# Patient Record
Sex: Female | Born: 1990 | Race: White | Hispanic: No | Marital: Married | Smoking: Former smoker
Health system: Southern US, Community
[De-identification: ages and names within clinical notes are randomized; demographics above are authoritative.]

## PROBLEM LIST (undated history)

## (undated) DIAGNOSIS — K8020 Calculus of gallbladder without cholecystitis without obstruction: Secondary | ICD-10-CM

## (undated) DIAGNOSIS — K819 Cholecystitis, unspecified: Secondary | ICD-10-CM

## (undated) DIAGNOSIS — E039 Hypothyroidism, unspecified: Secondary | ICD-10-CM

## (undated) HISTORY — PX: WISDOM TOOTH EXTRACTION: SHX21

## (undated) HISTORY — DX: Hypothyroidism, unspecified: E03.9

---
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
## 2011-01-27 LAB — POCT RAPID STREP A: Streptococcus, Group A Screen (Direct): NEGATIVE

## 2011-02-04 LAB — POCT RAPID STREP A: Streptococcus, Group A Screen (Direct): NEGATIVE

## 2011-06-07 ENCOUNTER — Other Ambulatory Visit: Payer: Self-pay | Admitting: *Deleted

## 2011-06-07 ENCOUNTER — Encounter: Payer: Self-pay | Admitting: Gynecology

## 2011-06-07 DIAGNOSIS — N946 Dysmenorrhea, unspecified: Secondary | ICD-10-CM | POA: Insufficient documentation

## 2011-06-07 DIAGNOSIS — S32059A Unspecified fracture of fifth lumbar vertebra, initial encounter for closed fracture: Secondary | ICD-10-CM | POA: Insufficient documentation

## 2011-06-07 MED ORDER — NORETHINDRONE ACET-ETHINYL EST 1-20 MG-MCG PO TABS: 1.0000 | ORAL_TABLET | Freq: Every day | ORAL | Status: DC

## 2011-06-15 ENCOUNTER — Encounter: Payer: Self-pay | Admitting: Women's Health

## 2011-06-20 ENCOUNTER — Ambulatory Visit (INDEPENDENT_AMBULATORY_CARE_PROVIDER_SITE_OTHER): Payer: 59 | Admitting: Women's Health

## 2011-06-20 ENCOUNTER — Encounter: Payer: Self-pay | Admitting: Women's Health

## 2011-06-20 VITALS — BP 114/70 | Ht 65.5 in | Wt 172.0 lb

## 2011-06-20 DIAGNOSIS — Z309 Encounter for contraceptive management, unspecified: Secondary | ICD-10-CM

## 2011-06-20 DIAGNOSIS — Z833 Family history of diabetes mellitus: Secondary | ICD-10-CM

## 2011-06-20 DIAGNOSIS — Z01419 Encounter for gynecological examination (general) (routine) without abnormal findings: Secondary | ICD-10-CM

## 2011-06-20 DIAGNOSIS — IMO0001 Reserved for inherently not codable concepts without codable children: Secondary | ICD-10-CM

## 2011-06-20 LAB — CBC WITH DIFFERENTIAL/PLATELET
Basophils Absolute: 0 10*3/uL (ref 0.0–0.1)
Basophils Relative: 0 % (ref 0–1)
Eosinophils Absolute: 0 10*3/uL (ref 0.0–0.7)
Eosinophils Relative: 1 % (ref 0–5)
HCT: 40.1 % (ref 36.0–46.0)
Hemoglobin: 13.5 g/dL (ref 12.0–15.0)
Lymphs Abs: 2 10*3/uL (ref 0.7–4.0)
MCH: 29.6 pg (ref 26.0–34.0)
MCHC: 33.7 g/dL (ref 30.0–36.0)
MCV: 87.9 fL (ref 78.0–100.0)
Monocytes Absolute: 0.4 10*3/uL (ref 0.1–1.0)
Monocytes Relative: 6 % (ref 3–12)
Neutro Abs: 4.7 10*3/uL (ref 1.7–7.7)
Neutrophils Relative %: 65 % (ref 43–77)
Platelets: 275 10*3/uL (ref 150–400)
RBC: 4.56 MIL/uL (ref 3.87–5.11)
RDW: 12.8 % (ref 11.5–15.5)
WBC: 7.2 10*3/uL (ref 4.0–10.5)

## 2011-06-20 LAB — GLUCOSE, RANDOM: Glucose, Bld: 90 mg/dL (ref 70–99)

## 2011-06-20 MED ORDER — NORETHINDRONE ACET-ETHINYL EST 1-20 MG-MCG PO TABS: 1.0000 | ORAL_TABLET | Freq: Every day | ORAL | Status: DC

## 2011-06-20 NOTE — Progress Notes (Signed)
Yesenia Scott 1990-05-23 [id number]    History:    The patient presents for annual exam.  Contracepting on Loestrin 1/20 with good relief of dysmenorrhea. Same partner. Completed gardasil series in 08.   Past medical history, past surgical history, family history and social history were all reviewed and documented in the EPIC chart. Working at [employer] taking classes at [employer].   ROS:  A  ROS was performed and pertinent positives and negatives are included in the history.  Exam:  Filed Vitals:   06/20/11 1035  BP: 114/70    General appearance:  Normal Head/Neck:  Normal, without cervical or supraclavicular adenopathy. Thyroid:  Symmetrical, normal in size, without palpable masses or nodularity. Respiratory  Effort:  Normal  Auscultation:  Clear without wheezing or rhonchi Cardiovascular  Auscultation:  Regular rate, without rubs, murmurs or gallops  Edema/varicosities:  Not grossly evident Abdominal  Soft,nontender, without masses, guarding or rebound.  Liver/spleen:  No organomegaly noted  Hernia:  None appreciated  Skin  Inspection:  Grossly normal  Palpation:  Grossly normal Neurologic/psychiatric  Orientation:  Normal with appropriate conversation.  Mood/affect:  Normal  Genitourinary    Breasts: Examined lying and sitting.     Right: Without masses, retractions, discharge or axillary adenopathy.     Left: Without masses, retractions, discharge or axillary adenopathy.   Inguinal/mons:  Normal without inguinal adenopathy  External genitalia:  Normal  BUS/Urethra/Skene's glands:  Normal  Bladder:  Normal  Vagina:  Normal  Cervix:  Normal  Uterus:   normal in size, shape and contour.  Midline and mobile  Adnexa/parametria:     Rt: Without masses or tenderness.   Lt: Without masses or tenderness.  Anus and perineum: Normal  Digital rectal exam:   Assessment/Plan:  21 y.o. SWF G0 for annual exam without complaint  Normal GYN exam contracepting on Loestrin  1/20  Plan: Loestrin 1/20 prescription, proper use, slight risk for blood clots and strokes reviewed. Condoms encouraged until permanent partner. SBE's, exercise, MVI daily encouraged. CBC, glucose.(Unable to void, no UTI symptoms)   Harrington Challenger [facility], 11:17 AM 06/20/2011

## 2011-07-13 ENCOUNTER — Other Ambulatory Visit: Payer: Self-pay | Admitting: Plastic Surgery

## 2011-07-13 ENCOUNTER — Encounter (HOSPITAL_BASED_OUTPATIENT_CLINIC_OR_DEPARTMENT_OTHER): Payer: Self-pay | Admitting: *Deleted

## 2011-07-18 ENCOUNTER — Encounter (HOSPITAL_BASED_OUTPATIENT_CLINIC_OR_DEPARTMENT_OTHER): Payer: Self-pay | Admitting: Anesthesiology

## 2011-07-18 ENCOUNTER — Ambulatory Visit (HOSPITAL_BASED_OUTPATIENT_CLINIC_OR_DEPARTMENT_OTHER): Payer: 59 | Admitting: Anesthesiology

## 2011-07-18 ENCOUNTER — Encounter (HOSPITAL_BASED_OUTPATIENT_CLINIC_OR_DEPARTMENT_OTHER)
Admission: RE | Disposition: A | Discharge status: Home / Self Care | Payer: Self-pay | Source: Ambulatory Visit | Attending: Plastic Surgery

## 2011-07-18 ENCOUNTER — Encounter (HOSPITAL_BASED_OUTPATIENT_CLINIC_OR_DEPARTMENT_OTHER): Payer: Self-pay | Admitting: *Deleted

## 2011-07-18 ENCOUNTER — Ambulatory Visit (HOSPITAL_BASED_OUTPATIENT_CLINIC_OR_DEPARTMENT_OTHER)
Admission: RE | Admit: 2011-07-18 | Discharge: 2011-07-18 | Disposition: A | Discharge status: Home / Self Care | Payer: 59 | Source: Ambulatory Visit | Attending: Plastic Surgery | Admitting: Plastic Surgery

## 2011-07-18 DIAGNOSIS — D239 Other benign neoplasm of skin, unspecified: Secondary | ICD-10-CM | POA: Insufficient documentation

## 2011-07-18 DIAGNOSIS — D235 Other benign neoplasm of skin of trunk: Secondary | ICD-10-CM | POA: Insufficient documentation

## 2011-07-18 DIAGNOSIS — Z87891 Personal history of nicotine dependence: Secondary | ICD-10-CM | POA: Insufficient documentation

## 2011-07-18 DIAGNOSIS — L989 Disorder of the skin and subcutaneous tissue, unspecified: Secondary | ICD-10-CM | POA: Diagnosis present

## 2011-07-18 DIAGNOSIS — Z01812 Encounter for preprocedural laboratory examination: Secondary | ICD-10-CM | POA: Insufficient documentation

## 2011-07-18 DIAGNOSIS — D234 Other benign neoplasm of skin of scalp and neck: Secondary | ICD-10-CM | POA: Insufficient documentation

## 2011-07-18 HISTORY — PX: NEVUS EXCISION: SHX5263

## 2011-07-18 LAB — POCT HEMOGLOBIN-HEMACUE: Hemoglobin: 13.9 g/dL (ref 12.0–15.0)

## 2011-07-18 SURGERY — EXCISION, NEVUS
Anesthesia: General | Wound class: Clean

## 2011-07-18 MED ORDER — BACITRACIN ZINC 500 UNIT/GM EX OINT
TOPICAL_OINTMENT | CUTANEOUS | Status: DC | PRN
  Administered 2011-07-18: 1 via TOPICAL

## 2011-07-18 MED ORDER — MIDAZOLAM HCL 5 MG/5ML IJ SOLN
INTRAMUSCULAR | Status: DC | PRN
  Administered 2011-07-18: 1.5 mg via INTRAVENOUS

## 2011-07-18 MED ORDER — CIPROFLOXACIN HCL 500 MG PO TABS: 500.0000 mg | ORAL_TABLET | Freq: Two times a day (BID) | ORAL | Status: AC

## 2011-07-18 MED ORDER — DEXAMETHASONE SODIUM PHOSPHATE 4 MG/ML IJ SOLN
INTRAMUSCULAR | Status: DC | PRN
  Administered 2011-07-18: 10 mg via INTRAVENOUS

## 2011-07-18 MED ORDER — HYDROCODONE-ACETAMINOPHEN 5-325 MG PO TABS: 1.0000 | ORAL_TABLET | Freq: Four times a day (QID) | ORAL | Status: AC | PRN

## 2011-07-18 MED ORDER — CIPROFLOXACIN IN D5W 400 MG/200ML IV SOLN
400.0000 mg | INTRAVENOUS | Status: AC
  Administered 2011-07-18: 400 mg via INTRAVENOUS

## 2011-07-18 MED ORDER — LIDOCAINE-EPINEPHRINE 1 %-1:100000 IJ SOLN
INTRAMUSCULAR | Status: DC | PRN
  Administered 2011-07-18 (×2): 2 mL
  Administered 2011-07-18: 3.5 mL
  Administered 2011-07-18: .5 mL

## 2011-07-18 MED ORDER — LIDOCAINE HCL (CARDIAC) 20 MG/ML IV SOLN
INTRAVENOUS | Status: DC | PRN
  Administered 2011-07-18: 75 mg via INTRAVENOUS

## 2011-07-18 MED ORDER — ONDANSETRON HCL 4 MG/2ML IJ SOLN: 4.0000 mg | Freq: Four times a day (QID) | INTRAMUSCULAR | Status: DC | PRN

## 2011-07-18 MED ORDER — ONDANSETRON HCL 4 MG/2ML IJ SOLN
INTRAMUSCULAR | Status: DC | PRN
  Administered 2011-07-18: 4 mg via INTRAVENOUS

## 2011-07-18 MED ORDER — PROPOFOL 10 MG/ML IV EMUL
INTRAVENOUS | Status: DC | PRN
  Administered 2011-07-18: 200 mg via INTRAVENOUS

## 2011-07-18 MED ORDER — SUCCINYLCHOLINE CHLORIDE 20 MG/ML IJ SOLN
INTRAMUSCULAR | Status: DC | PRN
  Administered 2011-07-18: 100 mg via INTRAVENOUS

## 2011-07-18 MED ORDER — HYDROCODONE-ACETAMINOPHEN 5-325 MG PO TABS
1.0000 | ORAL_TABLET | Freq: Once | ORAL | Status: AC | PRN
  Administered 2011-07-18: 1 via ORAL

## 2011-07-18 MED ORDER — FENTANYL CITRATE 0.05 MG/ML IJ SOLN
INTRAMUSCULAR | Status: DC | PRN
  Administered 2011-07-18: 100 ug via INTRAVENOUS

## 2011-07-18 MED ORDER — HYDROMORPHONE HCL PF 1 MG/ML IJ SOLN
0.2500 mg | INTRAMUSCULAR | Status: DC | PRN
  Administered 2011-07-18: 0.5 mg via INTRAVENOUS

## 2011-07-18 MED ORDER — LACTATED RINGERS IV SOLN
INTRAVENOUS | Status: DC
  Administered 2011-07-18 (×2): via INTRAVENOUS

## 2011-07-18 SURGICAL SUPPLY — 73 items
ADH SKN CLS APL DERMABOND .7 (GAUZE/BANDAGES/DRESSINGS) ×1
APL SKNCLS STERI-STRIP NONHPOA (GAUZE/BANDAGES/DRESSINGS)
BALL CTTN LRG ABS STRL LF (GAUZE/BANDAGES/DRESSINGS) ×1
BANDAGE CONFORM 2  STR LF (GAUZE/BANDAGES/DRESSINGS) IMPLANT
BANDAGE ELASTIC 3 VELCRO ST LF (GAUZE/BANDAGES/DRESSINGS) ×1 IMPLANT
BANDAGE ELASTIC 4 VELCRO ST LF (GAUZE/BANDAGES/DRESSINGS) ×1 IMPLANT
BANDAGE GAUZE ELAST BULKY 4 IN (GAUZE/BANDAGES/DRESSINGS) ×1 IMPLANT
BENZOIN TINCTURE PRP APPL 2/3 (GAUZE/BANDAGES/DRESSINGS) IMPLANT
BLADE SURG 15 STRL LF DISP TIS (BLADE) ×1 IMPLANT
BLADE SURG 15 STRL SS (BLADE) ×6
BLADE SURG ROTATE 9660 (MISCELLANEOUS) IMPLANT
BNDG CMPR MD 5X2 ELC HKLP STRL (GAUZE/BANDAGES/DRESSINGS)
BNDG ELASTIC 2 VLCR STRL LF (GAUZE/BANDAGES/DRESSINGS) IMPLANT
CANISTER SUCTION 1200CC (MISCELLANEOUS) IMPLANT
CHLORAPREP W/TINT 26ML (MISCELLANEOUS) ×3 IMPLANT
CLEANER CAUTERY TIP 5X5 PAD (MISCELLANEOUS) IMPLANT
CLOTH BEACON ORANGE TIMEOUT ST (SAFETY) ×2 IMPLANT
CORDS BIPOLAR (ELECTRODE) IMPLANT
COTTONBALL LRG STERILE PKG (GAUZE/BANDAGES/DRESSINGS) ×1 IMPLANT
COVER MAYO STAND STRL (DRAPES) ×2 IMPLANT
COVER TABLE BACK 60X90 (DRAPES) ×2 IMPLANT
DERMABOND ADVANCED (GAUZE/BANDAGES/DRESSINGS) ×1
DERMABOND ADVANCED .7 DNX12 (GAUZE/BANDAGES/DRESSINGS) ×1 IMPLANT
DRAPE U-SHAPE 76X120 STRL (DRAPES) ×1 IMPLANT
DRSG TEGADERM 2-3/8X2-3/4 SM (GAUZE/BANDAGES/DRESSINGS) ×10 IMPLANT
ELECT COATED BLADE 2.86 ST (ELECTRODE) IMPLANT
ELECT NDL BLADE 2-5/6 (NEEDLE) ×1 IMPLANT
ELECT NEEDLE BLADE 2-5/6 (NEEDLE) ×2 IMPLANT
ELECT REM PT RETURN 9FT ADLT (ELECTROSURGICAL) ×2
ELECT REM PT RETURN 9FT PED (ELECTROSURGICAL)
ELECTRODE REM PT RETRN 9FT PED (ELECTROSURGICAL) IMPLANT
ELECTRODE REM PT RTRN 9FT ADLT (ELECTROSURGICAL) IMPLANT
GAUZE SPONGE 4X4 12PLY STRL LF (GAUZE/BANDAGES/DRESSINGS) ×1 IMPLANT
GAUZE SPONGE 4X4 16PLY XRAY LF (GAUZE/BANDAGES/DRESSINGS) ×1 IMPLANT
GAUZE XEROFORM 1X8 LF (GAUZE/BANDAGES/DRESSINGS) ×1 IMPLANT
GLOVE BIO SURGEON STRL SZ 6.5 (GLOVE) ×8 IMPLANT
GOWN PREVENTION PLUS XLARGE (GOWN DISPOSABLE) ×5 IMPLANT
NDL HYPO 30GX1 BEV (NEEDLE) IMPLANT
NEEDLE 27GAX1X1/2 (NEEDLE) IMPLANT
NEEDLE HYPO 30GX1 BEV (NEEDLE) ×2 IMPLANT
NS IRRIG 1000ML POUR BTL (IV SOLUTION) ×1 IMPLANT
PACK BASIN DAY SURGERY FS (CUSTOM PROCEDURE TRAY) ×2 IMPLANT
PAD CAST 4YDX4 CTTN HI CHSV (CAST SUPPLIES) IMPLANT
PAD CLEANER CAUTERY TIP 5X5 (MISCELLANEOUS)
PADDING CAST COTTON 4X4 STRL (CAST SUPPLIES) ×4
PENCIL BUTTON HOLSTER BLD 10FT (ELECTRODE) ×2 IMPLANT
PUNCH BIOPSY DERMAL 3MM (MISCELLANEOUS) ×2 IMPLANT
RUBBERBAND STERILE (MISCELLANEOUS) IMPLANT
SHEET MEDIUM DRAPE 40X70 STRL (DRAPES) ×1 IMPLANT
SPLINT FAST PLASTER 5X30 (CAST SUPPLIES) ×10
SPLINT PLASTER CAST FAST 5X30 (CAST SUPPLIES) IMPLANT
SPONGE GAUZE 2X2 8PLY STRL LF (GAUZE/BANDAGES/DRESSINGS) ×4 IMPLANT
STRIP CLOSURE SKIN 1/2X4 (GAUZE/BANDAGES/DRESSINGS) ×1 IMPLANT
SUCTION FRAZIER TIP 10 FR DISP (SUCTIONS) IMPLANT
SUT CHROMIC 4 0 P 3 18 (SUTURE) IMPLANT
SUT ETHILON 4 0 PS 2 18 (SUTURE) IMPLANT
SUT ETHILON 5 0 P 3 18 (SUTURE)
SUT MNCRL AB 4-0 PS2 18 (SUTURE) ×1 IMPLANT
SUT MON AB 5-0 P3 18 (SUTURE) IMPLANT
SUT MON AB 5-0 PS2 18 (SUTURE) ×3 IMPLANT
SUT NYLON ETHILON 5-0 P-3 1X18 (SUTURE) IMPLANT
SUT PLAIN 5 0 P 3 18 (SUTURE) IMPLANT
SUT SILK 4 0 P 3 (SUTURE) ×3 IMPLANT
SUT VIC AB 4-0 P-3 18XBRD (SUTURE) IMPLANT
SUT VIC AB 4-0 P3 18 (SUTURE) ×2
SUT VIC AB 5-0 P-3 18X BRD (SUTURE) IMPLANT
SUT VIC AB 5-0 P3 18 (SUTURE) ×2
SUT VICRYL 4-0 PS2 18IN ABS (SUTURE) IMPLANT
SYR BULB 3OZ (MISCELLANEOUS) IMPLANT
SYR CONTROL 10ML LL (SYRINGE) ×2 IMPLANT
TRAY DSU PREP LF (CUSTOM PROCEDURE TRAY) ×2 IMPLANT
TUBE CONNECTING 20X1/4 (TUBING) ×1 IMPLANT
WATER STERILE IRR 1000ML POUR (IV SOLUTION) IMPLANT

## 2011-07-18 NOTE — Transfer of Care (Signed)
Immediate Anesthesia Transfer of Care Note  Patient: Yesenia Scott  Procedure(s) Performed: Procedure(s) (LRB): NEVUS EXCISION (N/A)  Patient Location: PACU  Anesthesia Type: General  Level of Consciousness: awake  Airway & Oxygen Therapy: Patient Spontanous Breathing and Patient connected to face mask oxygen  Post-op Assessment: Report given to PACU RN and Post -op Vital signs reviewed and stable  Post vital signs: Reviewed and stable  Complications: No apparent anesthesia complications

## 2011-07-18 NOTE — Anesthesia Postprocedure Evaluation (Signed)
Anesthesia Post Note  Patient: Yesenia Scott  Procedure(s) Performed: Procedure(s) (LRB): NEVUS EXCISION (N/A)  Anesthesia type: General  Patient location: PACU  Post pain: Pain level controlled and Adequate analgesia  Post assessment: Post-op Vital signs reviewed, Patient's Cardiovascular Status Stable, Respiratory Function Stable, Patent Airway and Pain level controlled  Last Vitals:  Filed Vitals:   07/18/11 1145  BP: 101/55  Pulse: 97  Temp:   Resp: 25    Post vital signs: Reviewed and stable  Level of consciousness: awake, alert  and oriented  Complications: No apparent anesthesia complications

## 2011-07-18 NOTE — Discharge Instructions (Addendum)
May shower May remove tegaderm in 2 days Don't remove splint. May walk on Rt foot.  [facility]  [address] [place], [place] [address] [phone]   Post Anesthesia Home Care Instructions  Activity: Get plenty of rest for the remainder of the day. A responsible adult should stay with you for 24 hours following the procedure.  For the next 24 hours, DO NOT: -Drive a car -Advertising copywriter -Drink alcoholic beverages -Take any medication unless instructed by your physician -Make any legal decisions or sign important papers.  Meals: Start with liquid foods such as gelatin or soup. Progress to regular foods as tolerated. Avoid greasy, spicy, heavy foods. If nausea and/or vomiting occur, drink only clear liquids until the nausea and/or vomiting subsides. Call your physician if vomiting continues.  Special Instructions/Symptoms: Your throat may feel dry or sore from the anesthesia or the breathing tube placed in your throat during surgery. If this causes discomfort, gargle with warm salt water. The discomfort should disappear within 24 hours.

## 2011-07-18 NOTE — Anesthesia Preprocedure Evaluation (Signed)
Anesthesia Evaluation  Patient identified by MRN, date of birth, ID band Patient awake    Reviewed: Allergy & Precautions, H&P , NPO status , Patient's Chart, lab work & pertinent test results  Airway Mallampati: I  Neck ROM: full    Dental   Pulmonary former smoker         Cardiovascular     Neuro/Psych    GI/Hepatic   Endo/Other    Renal/GU      Musculoskeletal   Abdominal   Peds  Hematology   Anesthesia Other Findings   Reproductive/Obstetrics                           Anesthesia Physical Anesthesia Plan  ASA: I  Anesthesia Plan: General   Post-op Pain Management:    Induction: Intravenous  Airway Management Planned: Oral ETT  Additional Equipment:   Intra-op Plan:   Post-operative Plan: Extubation in OR  Informed Consent: I have reviewed the patients History and Physical, chart, labs and discussed the procedure including the risks, benefits and alternatives for the proposed anesthesia with the patient or authorized representative who has indicated his/her understanding and acceptance.     Plan Discussed with: CRNA and Surgeon  Anesthesia Plan Comments:         Anesthesia Quick Evaluation

## 2011-07-18 NOTE — H&P (Signed)
History and Physical  Yesenia Scott   07/05/2011 2:00 PM Office Visit  MRN: [id number]   Department: Plastic Surgery  Dept Phone: [phone]  Description: Female DOB: [birth year]  Provider: Wayland Denis, DO    Diagnoses    Changing skin lesion   - Primary   709.9     Reason for Visit  -  Skin Problem      Subjective:    Patient ID: Yesenia Scott is a 21 y.o. female.  HPI [name] is a 21 yrs old wf here with her mom for history and physical for excision of several changing skin lesions. She has one on her right small medial toe that is hyperpigmented, ~ 1cm, slightly rough and raised. One on her medial left breast, medial right breast and abdomen that are flesh colored and ~16mm in size. One on the left temporal area near the left ear that is flesh colored, ~4 mm in size, slightly raised and itches. There is a nonraised, irregular, dark hyperpigmented lesion on the upper back, ~89mm in size.  All of the lesions are changing, getting larger and are irritating to the patient. She does not have any family history of skin cancer and has not had any herself. Several of the areas have gotten dry and bleed.  The following portions of the patient's history were reviewed and updated as appropriate: allergies, current medications, past family history, past medical history, past social history, past surgical history and problem list.  Review of Systems  Constitutional: Negative.   HENT: Negative.   Eyes: Negative.   Respiratory: Negative.   Cardiovascular: Negative.   Gastrointestinal: Negative.   Genitourinary: Negative.   Musculoskeletal: Negative.   Skin: Negative.   Neurological: Negative.   Hematological: Negative.   Psychiatric/Behavioral: Negative.       Objective:    Physical Exam  Constitutional: She appears well-developed and well-nourished.  HENT:   Head: Normocephalic and atraumatic.  Eyes: Conjunctivae are normal. Pupils are equal, round, and reactive to light.    Neck: Normal range of motion.  Cardiovascular: Normal rate.   Pulmonary/Chest: Effort normal.  Abdominal: Soft.  Musculoskeletal: Normal range of motion.  Neurological: She is alert.  Skin: Skin is warm.  Psychiatric: She has a normal mood and affect. Her behavior is normal. Judgment and thought content normal.   Assessment:  1.  Changing skin lesion     Plan:    Excision of changing skin lesions chest, abdomen, right small toe and scalp.  Risks and complications were reviewed and include bleeding, pain, scar and risk of anesthesia.  The patient was seen in holding, risks reviewed and consent signed.  There is no change in her history or physical.

## 2011-07-18 NOTE — Anesthesia Procedure Notes (Signed)
Procedure Name: Intubation Date/Time: 07/18/2011 9:41 AM Performed by: Lance Coon Pre-anesthesia Checklist: Patient identified, Timeout performed, Emergency Drugs available, Suction available and Patient being monitored Patient Re-evaluated:Patient Re-evaluated prior to inductionOxygen Delivery Method: Circle system utilized Preoxygenation: Pre-oxygenation with 100% oxygen Intubation Type: IV induction Ventilation: Mask ventilation without difficulty Laryngoscope Size: Mac and 3 Grade View: Grade I Tube type: Oral Tube size: 7.0 mm Number of attempts: 1 Airway Equipment and Method: Stylet and LTA kit utilized Placement Confirmation: ETT inserted through vocal cords under direct vision,  positive ETCO2 and breath sounds checked- equal and bilateral Secured at: 18 cm Tube secured with: Tape Dental Injury: Teeth and Oropharynx as per pre-operative assessment

## 2011-07-18 NOTE — Brief Op Note (Signed)
07/18/2011  11:26 AM  PATIENT:  Yesenia Scott  21 y.o. female  PRE-OPERATIVE DIAGNOSIS:  changing skin lesions of left scalp, medial chest, left breast, abdomen back x 2, right small toe  POST-OPERATIVE DIAGNOSIS:  same  PROCEDURE:  Procedure(s) (LRB): excision of changing skin lesions of left scalp, left breast, abdomen and back. Punch biopsy of back left and medial chest lesion.  Excision of right small toe lesion with placement of FTSG  SURGEON:  Surgeon(s) and Role:    * Andron Marrazzo Sanger, DO - Primary  PHYSICIAN ASSISTANT: none  ASSISTANTS: none   ANESTHESIA:   local and general  EBL:  Total I/O In: 1000 [I.V.:1000] Out: -   BLOOD ADMINISTERED:none  DRAINS: none   LOCAL MEDICATIONS USED:  LIDOCAINE   SPECIMEN:  Source of Specimen:  changing skin lesions  DISPOSITION OF SPECIMEN:  PATHOLOGY  COUNTS:  YES  TOURNIQUET:  * No tourniquets in log *  DICTATION: dictated  PLAN OF CARE: Discharge to home after PACU  PATIENT DISPOSITION:  PACU - hemodynamically stable.   Delay start of Pharmacological VTE agent (>24hrs) due to surgical blood loss or risk of bleeding: no

## 2011-07-19 NOTE — Op Note (Signed)
NAME:  LORAN, AUGUSTE                   ACCOUNT NO.:  MEDICAL RECORD NO.:  [id number]  LOCATION:                                 FACILITY:  PHYSICIAN:  Wayland Denis, DO      DATE OF BIRTH:  1990/07/06  DATE OF PROCEDURE:  07/18/2011 DATE OF DISCHARGE:                              OPERATIVE REPORT   PREOPERATIVE DIAGNOSIS:  Changing lesion on scalp, left breast, medial chest, abdomen, back x2, and right small toe.  PROCEDURE: 1. Excision of scalp changing lesion, left breast changing lesion,     abdominal changing lesion, right back changing lesion. 2. Punch biopsy of medial chest lesion, left back lesion. 3. Excision of right small toe lesion with full-thickness skin graft     placement.  ATTENDING:  Wayland Denis, DO  ANESTHESIA:  General.  INDICATION FOR PROCEDURE:  The patient is a 21 year old female who had several changing lesions.  Risks and complications were reviewed including bleeding, pain scar, risk of anesthesia, scar contracture, and she wished to proceed due to the continual changing in look of the lesions and skin irritation.  A consent was signed and confirmed, and the patient was taken to the operating room.  DESCRIPTION OF PROCEDURE:  The patient was taken to the operating room, placed on the operating room table in the supine position.  General anesthesia was administered.  Once adequate, a time-out was called and all information was confirmed to be correct.  She was placed in the right lateral position, was prepped and draped in the usual sterile fashion.  The area of the back on the right and the left were marked and 1% lidocaine with epinephrine was injected.  After waiting several minutes for the epinephrine to take effect, a punch biopsy of 3 mm was performed on the left back lesion and it was closed with vertical mattress 5-0 Monocryl.  The right lesion was excised in elliptical fashion and closed with the 4-0 Monocryl, Steri-Strips, 2 x 2  and Tegaderm was placed.  The right back lesion was 0.5 cm x 0.5 cm.  The patient was then placed in the supine position and re-prepped and draped.  The abdominal lesion was excised in elliptical fashion, 1.5 x 0.5 cm, and sent to Pathology.  The skin edges were reapproximated with a 5-0 Monocryl.  A 3-mm punch biopsy was performed of the right medial chest area and a 5-0 Monocryl was used to reapproximate the skin edges. The left breast was 0.5 x 5.5 cm and elliptical incision was used to excise the lesion.  It was closed with 4-0 Monocryl.  On all of the anterior lesions, the Steri-Strips were applied with a 2 x 2 and a Tegaderm.  Attention was then turned to the toe and the right small medial aspect was marked and 1% lidocaine with epinephrine was injected. Once the intraoperative epinephrine effect was in place, a 15-blade was used to excise the lesion.  The 15- blade was used for all the other lesions as well.  The lesion was excised and marked long stitch lateral and short stitch distal, and the lesion was sent to Pathology.  A full- thickness  skin graft was obtained and this was 1 x 0.5 cm.  The skin graft was obtained from the medial foot.  The area was marked and 1% lidocaine with epinephrine was injected.  After waiting several minutes, the skin was excised at full thickness level and placed on the recipient site.  The donor site was closed with 4-0 Vicryl and 4-0 Monocryl. Antibiotic ointment was applied.  The full-thickness skin graft was tacked into place with 5-0 Vicryl and silk bolsters with the Xeroform. Kerlix and a blocking splint was used to mobilize the foot.  Attention was then turned to the scalp and a 0.5 x 0.4 lesion was excised in elliptical fashion and sent to Pathology.  The skin edges were reapproximated with a 5-0 Monocryl and antibiotic cream was applied.  The patient tolerated the procedure well.  There were no complications.  She was awoken and taken to  recovery room in stable condition.     Wayland Denis, DO     CS/MEDQ  D:  07/18/2011  T:  07/18/2011  Job:  [id number]

## 2011-07-21 ENCOUNTER — Encounter (HOSPITAL_BASED_OUTPATIENT_CLINIC_OR_DEPARTMENT_OTHER): Payer: Self-pay | Admitting: Plastic Surgery

## 2011-07-21 ENCOUNTER — Encounter (HOSPITAL_BASED_OUTPATIENT_CLINIC_OR_DEPARTMENT_OTHER): Payer: Self-pay

## 2011-08-01 HISTORY — PX: OTHER SURGICAL HISTORY: SHX169

## 2012-06-22 ENCOUNTER — Ambulatory Visit (INDEPENDENT_AMBULATORY_CARE_PROVIDER_SITE_OTHER): Payer: 59 | Admitting: Women's Health

## 2012-06-22 ENCOUNTER — Other Ambulatory Visit (HOSPITAL_COMMUNITY)
Admission: RE | Admit: 2012-06-22 | Discharge: 2012-06-22 | Disposition: A | Discharge status: Home / Self Care | Payer: 59 | Source: Ambulatory Visit | Attending: Women's Health | Admitting: Women's Health

## 2012-06-22 ENCOUNTER — Encounter: Payer: Self-pay | Admitting: Women's Health

## 2012-06-22 VITALS — BP 130/70 | Ht 65.0 in | Wt 156.0 lb

## 2012-06-22 DIAGNOSIS — IMO0001 Reserved for inherently not codable concepts without codable children: Secondary | ICD-10-CM

## 2012-06-22 DIAGNOSIS — Z309 Encounter for contraceptive management, unspecified: Secondary | ICD-10-CM

## 2012-06-22 DIAGNOSIS — Z01419 Encounter for gynecological examination (general) (routine) without abnormal findings: Secondary | ICD-10-CM | POA: Insufficient documentation

## 2012-06-22 DIAGNOSIS — Z833 Family history of diabetes mellitus: Secondary | ICD-10-CM

## 2012-06-22 DIAGNOSIS — N898 Other specified noninflammatory disorders of vagina: Secondary | ICD-10-CM

## 2012-06-22 DIAGNOSIS — Z1151 Encounter for screening for human papillomavirus (HPV): Secondary | ICD-10-CM | POA: Insufficient documentation

## 2012-06-22 LAB — CBC WITH DIFFERENTIAL/PLATELET
Basophils Absolute: 0 10*3/uL (ref 0.0–0.1)
Lymphocytes Relative: 29 % (ref 12–46)
Lymphs Abs: 3.2 10*3/uL (ref 0.7–4.0)
Neutro Abs: 7.2 10*3/uL (ref 1.7–7.7)
Neutrophils Relative %: 64 % (ref 43–77)
Platelets: 336 10*3/uL (ref 150–400)
RBC: 4.59 MIL/uL (ref 3.87–5.11)
RDW: 13.3 % (ref 11.5–15.5)
WBC: 11.4 10*3/uL — ABNORMAL HIGH (ref 4.0–10.5)

## 2012-06-22 LAB — WET PREP FOR TRICH, YEAST, CLUE
Clue Cells Wet Prep HPF POC: NONE SEEN
Trich, Wet Prep: NONE SEEN

## 2012-06-22 MED ORDER — TERCONAZOLE 0.4 % VA CREA: 1.0000 | TOPICAL_CREAM | Freq: Every day | VAGINAL | Status: DC

## 2012-06-22 MED ORDER — NORETHINDRONE ACET-ETHINYL EST 1-20 MG-MCG PO TABS: 1.0000 | ORAL_TABLET | Freq: Every day | ORAL | Status: DC

## 2012-06-22 NOTE — Progress Notes (Signed)
Yesenia Scott 12-Jul-1990 [id number]    History:    The patient presents for annual exam.  Regular monthly cycle on Loestrin 1/20. Complaint of a white discharge with some external vaginal itching. Same partner. Gardasil completed 2008. Benign nevus removed 07/2011.   Past medical history, past surgical history, family history and social history were all reviewed and documented in the EPIC chart. Wedding planned October 2014. Works at [employer], going to school [employer]. Mother diabetic.   ROS:  A  ROS was performed and pertinent positives and negatives are included in the history.  Exam:  Filed Vitals:   06/22/12 1039  BP: 130/70    General appearance:  Normal Head/Neck:  Normal, without cervical or supraclavicular adenopathy. Thyroid:  Symmetrical, normal in size, without palpable masses or nodularity. Respiratory  Effort:  Normal  Auscultation:  Clear without wheezing or rhonchi Cardiovascular  Auscultation:  Regular rate, without rubs, murmurs or gallops  Edema/varicosities:  Not grossly evident Abdominal  Soft,nontender, without masses, guarding or rebound.  Liver/spleen:  No organomegaly noted  Hernia:  None appreciated  Skin  Inspection:  Grossly normal  Palpation:  Grossly normal Neurologic/psychiatric  Orientation:  Normal with appropriate conversation.  Mood/affect:  Normal  Genitourinary    Breasts: Examined lying and sitting.     Right: Without masses, retractions, discharge or axillary adenopathy.     Left: Without masses, retractions, discharge or axillary adenopathy.   Inguinal/mons:  Normal without inguinal adenopathy  External genitalia:  erythematous  BUS/Urethra/Skene's glands:  Normal  Bladder:  Normal  Vagina:  Normal-wet prep negative  Cervix:  Normal  Uterus:   normal in size, shape and contour.  Midline and mobile  Adnexa/parametria:     Rt: Without masses or tenderness.   Lt: Without masses or tenderness.  Anus and  perineum: Normal    Assessment/Plan:  22 y.o. S. WF G0 for annual exam.     External vaginal itching Contraceptives on Loestrin 1/20  Plan: Loestrin 1/20 prescription, proper use, slight risk for blood clots and strokes reviewed. SBE's, continue exercise, healthy diet, has lost 15 pounds in the past year with diet and exercise. MVI daily encouraged. CBC, glucose, UA, Pap. New screening guidelines reviewed. Terazol 7 instructed to apply externally as needed for vaginal itching and yeast prevention discussed.    Harrington Challenger WHNP, 11:09 AM 06/22/2012

## 2012-06-22 NOTE — Patient Instructions (Signed)
Health Maintenance, 18- to 21-Year-Old SCHOOL PERFORMANCE After high school completion, the young adult may be attending college, technical or vocational school, or entering the military or the work force. SOCIAL AND EMOTIONAL DEVELOPMENT The young adult establishes adult relationships and explores sexual identity. Young adults may be living at home or in a college dorm or apartment. Increasing independence is important with young adults. Throughout adolescence, teens should assume responsibility of their own health care. IMMUNIZATIONS Most young adults should be fully vaccinated. A booster dose of Tdap (tetanus, diphtheria, and pertussis, or "whooping cough"), a dose of meningococcal vaccine to protect against a certain type of bacterial meningitis, hepatitis A, human papillomarvirus (HPV), chickenpox, or measles vaccines may be indicated, if not given at an earlier age. Annual influenza or "flu" vaccination should be considered during flu season.   TESTING Annual screening for vision and hearing problems is recommended. Vision should be screened objectively at least once between 18 and 21 years of age. The young adult may be screened for anemia or tuberculosis. Young adults should have a blood test to check for high cholesterol during this time period. Young adults should be screened for use of alcohol and drugs. If the young adult is sexually active, screening for sexually transmitted infections, pregnancy, or HIV may be performed. Screening for cervical cancer should be performed within 3 years of beginning sexual activity. NUTRITION AND ORAL HEALTH  Adequate calcium intake is important. Consume 3 servings of low-fat milk and dairy products daily. For those who do not drink milk or consume dairy products, calcium enriched foods, such as juice, bread, or cereal, dark, leafy greens, or canned fish are alternate sources of calcium.   Drink plenty of water. Limit fruit juice to 8 to 12 ounces per day.  Avoid sugary beverages or sodas.   Discourage skipping meals, especially breakfast. Teens should eat a good variety of vegetables and fruits, as well as lean meats.   Avoid high fat, high salt, and high sugar foods, such as candy, chips, and cookies.   Encourage young adults to participate in meal planning and preparation.   Eat meals together as a family whenever possible. Encourage conversation at mealtime.   Limit fast food choices and eating out at restaurants.   Brush teeth twice a day and floss.   Schedule dental exams twice a year.  SLEEP Regular sleep habits are important. PHYSICAL, SOCIAL, AND EMOTIONAL DEVELOPMENT  One hour of regular physical activity daily is recommended. Continue to participate in sports.   Encourage young adults to develop their own interests and consider community service or volunteerism.   Provide guidance to the young adult in making decisions about college and work plans.   Make sure that young adults know that they should never be in a situation that makes them uncomfortable, and they should tell partners if they do not want to engage in sexual activity.   Talk to the young adult about body image. Eating disorders may be noted at this time. Young adults may also be concerned about being overweight. Monitor the young adult for weight gain or loss.   Mood disturbances, depression, anxiety, alcoholism, or attention problems may be noted in young adults. Talk to the caregiver if there are concerns about mental illness.   Negotiate limit setting and independent decision making.   Encourage the young adult to handle conflict without physical violence.   Avoid loud noises which may impair hearing.   Limit television and computer time to 2 hours per   day. Individuals who engage in excessive sedentary activity are more likely to become overweight.  RISK BEHAVIORS  Sexually active young adults need to take precautions against pregnancy and sexually  transmitted infections. Talk to young adults about contraception.   Provide a tobacco-free and drug-free environment for the young adult. Talk to the young adult about drug, tobacco, and alcohol use among friends or at friends' homes. Make sure the young adult knows that smoking tobacco or marijuana and taking drugs have health consequences and may impact brain development.   Teach the young adult about appropriate use of over-the-counter or prescription medicines.   Establish guidelines for driving and for riding with friends.   Talk to young adults about the risks of drinking and driving or boating. Encourage the young adult to call you if he or she or friends have been drinking or using drugs.   Remind young adults to wear seat belts at all times in cars and life vests in boats.   Young adults should always wear a properly fitted helmet when they are riding a bicycle.   Use caution with all-terrain vehicles (ATVs) or other motorized vehicles.   Do not keep handguns in the home. (If you do, the gun and ammunition should be locked separately and out of the young adult's access.)   Equip your home with smoke detectors and change the batteries regularly. Make sure all family members know the fire escape plans for your home.   Teach young adults not to swim alone and not to dive in shallow water.   All individuals should wear sunscreen that protects against UVA and UVB light with at least a sun protection factor (SPF) of 30 when out in the sun. This minimizes sun burning.  WHAT'S NEXT? Young adults should visit their pediatrician or family physician yearly. By young adulthood, health care should be transitioned to a family physician or internal medicine specialist. Sexually active females may want to begin annual physical exams with a gynecologist. Document Released: 07/14/2006 Document Revised: 07/11/2011 Document Reviewed: 08/03/2006 Exit[employer] Patient Information 2013 [employer].    

## 2012-06-22 NOTE — Addendum Note (Signed)
Addended by: Bertram Savin A on: 06/22/2012 04:04 PM   Modules accepted: Orders

## 2012-06-23 LAB — GLUCOSE, RANDOM: Glucose, Bld: 88 mg/dL (ref 70–99)

## 2012-06-23 LAB — URINALYSIS W MICROSCOPIC + REFLEX CULTURE
Bacteria, UA: NONE SEEN
Bilirubin Urine: NEGATIVE
Crystals: NONE SEEN
Nitrite: NEGATIVE
Protein, ur: NEGATIVE mg/dL
Specific Gravity, Urine: >1.03 — ABNORMAL HIGH (ref 1.005–1.030)
Squamous Epithelial / LPF: NONE SEEN
Urobilinogen, UA: 1 mg/dL (ref 0.0–1.0)

## 2012-09-27 ENCOUNTER — Encounter: Payer: Self-pay | Admitting: Family Medicine

## 2012-09-27 ENCOUNTER — Ambulatory Visit (INDEPENDENT_AMBULATORY_CARE_PROVIDER_SITE_OTHER): Payer: 59 | Admitting: Family Medicine

## 2012-09-27 VITALS — BP 108/72 | Temp 98.3°F | Ht 66.0 in | Wt 162.2 lb

## 2012-09-27 DIAGNOSIS — J019 Acute sinusitis, unspecified: Secondary | ICD-10-CM

## 2012-09-27 MED ORDER — AZITHROMYCIN 250 MG PO TABS: ORAL_TABLET | ORAL | Status: DC

## 2012-09-27 NOTE — Progress Notes (Signed)
  Subjective:    Patient ID: Yesenia Scott, female    DOB: [birth year], 22 y.o.   MRN: [id number]  Headache  This is a new problem. The current episode started in the past 7 days. The problem occurs constantly. The problem has been gradually worsening. Pain location: generalized. The pain does not radiate. The quality of the pain is described as throbbing. The pain is at a severity of 5/10. The pain is moderate. Associated symptoms include muscle aches and sinus pressure. Associated symptoms comments: Body aches, sensitivity to light, chills. Nothing aggravates the symptoms. She has tried NSAIDs for the symptoms. The treatment provided mild relief.      Review of Systems  HENT: Positive for sinus pressure.   Neurological: Positive for headaches.   Not a smoker Post nasal drip and cough    Objective:   Physical Exam  Vitals reviewed. Constitutional: She appears well-nourished.  HENT:  Head: Normocephalic.  Right Ear: External ear normal.  Left Ear: External ear normal.  Cardiovascular: Normal rate and normal heart sounds.   No murmur heard. Pulmonary/Chest: Effort normal and breath sounds normal. No respiratory distress.  Lymphadenopathy:    She has no cervical adenopathy.   Moderate sinus tenderness to palpation       Assessment & Plan:  Acute sinusitis- z pack, loratadine

## 2012-09-28 ENCOUNTER — Encounter: Payer: Self-pay | Admitting: Family Medicine

## 2012-10-04 ENCOUNTER — Telehealth: Payer: Self-pay | Admitting: Family Medicine

## 2012-10-04 NOTE — Telephone Encounter (Signed)
New Patient.  No Chart  Patient has an FMLA form that needs to be completed for dates Thursday Sep 27, 2012 through Sunday September 30, 2012, She returned to work on Monday October 01, 2012.    Patient also wanted to let Dr. Lorin Picket know she works around chemicals that she feels that may be causing these problems.  Thanks

## 2012-10-04 NOTE — Telephone Encounter (Signed)
FMLA form given to Toniann Fail to complete

## 2012-10-08 ENCOUNTER — Telehealth: Payer: Self-pay | Admitting: Family Medicine

## 2012-10-08 ENCOUNTER — Other Ambulatory Visit: Payer: Self-pay | Admitting: *Deleted

## 2012-10-08 MED ORDER — LEVOFLOXACIN 500 MG PO TABS: 500.0000 mg | ORAL_TABLET | Freq: Every day | ORAL | Status: AC

## 2012-10-08 NOTE — Telephone Encounter (Signed)
Levaquin 500 milligram, #10, one daily. If not doing better with this office visit would be necessary. Followup sooner problems.

## 2012-10-08 NOTE — Telephone Encounter (Signed)
Levaquin 500 milligram, #10, one daily called into [facility]. Pt notified on voicemail to call back if not better

## 2012-10-08 NOTE — Telephone Encounter (Signed)
No Chart--New Patient  Patient called and states her ears are still stopped up, her throat is still hurting.  States her throat hurts so bad she could cry.  Wants to know if Dr. Lorin Picket would please call her in a different antibiotic, she has completed the Z-Pack.  [facility] in [employer]

## 2013-02-27 ENCOUNTER — Ambulatory Visit (INDEPENDENT_AMBULATORY_CARE_PROVIDER_SITE_OTHER): Payer: 59 | Admitting: Nurse Practitioner

## 2013-02-27 ENCOUNTER — Encounter: Payer: Self-pay | Admitting: Nurse Practitioner

## 2013-02-27 VITALS — BP 112/62 | Temp 99.1°F | Ht 66.5 in | Wt 164.4 lb

## 2013-02-27 DIAGNOSIS — J069 Acute upper respiratory infection, unspecified: Secondary | ICD-10-CM

## 2013-02-27 MED ORDER — AZITHROMYCIN 250 MG PO TABS: ORAL_TABLET | ORAL | Status: DC

## 2013-02-28 ENCOUNTER — Encounter: Payer: Self-pay | Admitting: Nurse Practitioner

## 2013-02-28 NOTE — Progress Notes (Signed)
Subjective:  Presents complaints of sore throat for the past 2 days. No fever. Some chills. Headache. Body aches. Occasional cough. Producing green mucus. Slight right ear pain. No vomiting or diarrhea. Mild mid abdominal pain at times. No missed birth control pills. Taking fluids well. Voiding normal limit.  Objective:   BP 112/62  Temp(Src) 99.1 F (37.3 C)  Ht 5' 6.5" (1.689 m)  Wt 164 lb 6.4 oz (74.571 kg)  BMI 26.14 kg/m2 NAD. Alert, oriented. TMs significant clear effusion, more so on the right. No erythema. Pharynx erythematous with PND noted. Neck supple with mild soft nontender adenopathy. Lungs clear. Heart regular rate rhythm. Abdomen soft nontender.  Assessment:Acute upper respiratory infections of unspecified site  Plan: Meds ordered this encounter  Medications  . azithromycin (ZITHROMAX Z-PAK) 250 MG tablet    Sig: Take 2 tablets (500 mg) on  Day 1,  followed by 1 tablet (250 mg) once daily on Days 2 through 5.    Dispense:  6 each    Refill:  0    Order Specific Question:  Supervising Provider    Answer:  Merlyn Albert [[id number]]   OTC meds as directed for congestion and cough. Call back if worsens or persists.

## 2013-06-24 ENCOUNTER — Encounter: Payer: 59 | Admitting: Women's Health

## 2013-06-26 ENCOUNTER — Encounter: Payer: Self-pay | Admitting: Women's Health

## 2014-02-06 ENCOUNTER — Encounter: Payer: 59 | Admitting: Women's Health

## 2014-02-26 ENCOUNTER — Ambulatory Visit (INDEPENDENT_AMBULATORY_CARE_PROVIDER_SITE_OTHER): Payer: 59 | Admitting: Women's Health

## 2014-02-26 ENCOUNTER — Encounter: Payer: Self-pay | Admitting: Women's Health

## 2014-02-26 ENCOUNTER — Other Ambulatory Visit (HOSPITAL_COMMUNITY)
Admission: RE | Admit: 2014-02-26 | Discharge: 2014-02-26 | Disposition: A | Discharge status: Home / Self Care | Payer: 59 | Source: Ambulatory Visit | Attending: Gynecology | Admitting: Gynecology

## 2014-02-26 VITALS — BP 120/80 | Ht 65.0 in | Wt 190.0 lb

## 2014-02-26 DIAGNOSIS — Z3041 Encounter for surveillance of contraceptive pills: Secondary | ICD-10-CM

## 2014-02-26 DIAGNOSIS — Z01411 Encounter for gynecological examination (general) (routine) with abnormal findings: Secondary | ICD-10-CM | POA: Insufficient documentation

## 2014-02-26 DIAGNOSIS — Z833 Family history of diabetes mellitus: Secondary | ICD-10-CM

## 2014-02-26 DIAGNOSIS — Z01419 Encounter for gynecological examination (general) (routine) without abnormal findings: Secondary | ICD-10-CM

## 2014-02-26 LAB — CBC WITH DIFFERENTIAL/PLATELET
Basophils Absolute: 0 10*3/uL (ref 0.0–0.1)
Basophils Relative: 0 % (ref 0–1)
EOS ABS: 0 10*3/uL (ref 0.0–0.7)
Eosinophils Relative: 0 % (ref 0–5)
HCT: 37.9 % (ref 36.0–46.0)
Hemoglobin: 12.8 g/dL (ref 12.0–15.0)
LYMPHS ABS: 2.6 10*3/uL (ref 0.7–4.0)
Lymphocytes Relative: 29 % (ref 12–46)
MCH: 29.8 pg (ref 26.0–34.0)
MCHC: 33.8 g/dL (ref 30.0–36.0)
MCV: 88.1 fL (ref 78.0–100.0)
Monocytes Absolute: 0.5 10*3/uL (ref 0.1–1.0)
Monocytes Relative: 6 % (ref 3–12)
NEUTROS ABS: 5.9 10*3/uL (ref 1.7–7.7)
NEUTROS PCT: 65 % (ref 43–77)
PLATELETS: 268 10*3/uL (ref 150–400)
RBC: 4.3 MIL/uL (ref 3.87–5.11)
RDW: 13.2 % (ref 11.5–15.5)
WBC: 9 10*3/uL (ref 4.0–10.5)

## 2014-02-26 MED ORDER — NORETHINDRONE ACET-ETHINYL EST 1-20 MG-MCG PO TABS: 1.0000 | ORAL_TABLET | Freq: Every day | ORAL | Status: DC

## 2014-02-26 NOTE — Progress Notes (Signed)
Yesenia Scott 05/12/1990 [id number]    History:    Presents for annual exam.  Monthly cycle with heavier flow. Had been on Loestrin but stopped, contraception not needed. Gardasil series completed 2008. Pap ASCUS with negative HR HPV 2014. Married 01/2013 process of a divorce, currently in a lesbian relationship/partner negative STD screen. Has gained 35 pounds in the past year.  Past medical history, past surgical history, family history and social history were all reviewed and documented in the EPIC chart. Working and going to school. Mother diabetes. History of a vertebral fracture.  ROS:  A  12 point ROS was performed and pertinent positives and negatives are included.  Exam:  Filed Vitals:   02/26/14 1534  BP: 120/80    General appearance:  Normal Thyroid:  Symmetrical, normal in size, without palpable masses or nodularity. Respiratory  Auscultation:  Clear without wheezing or rhonchi Cardiovascular  Auscultation:  Regular rate, without rubs, murmurs or gallops  Edema/varicosities:  Not grossly evident Abdominal  Soft,nontender, without masses, guarding or rebound.  Liver/spleen:  No organomegaly noted  Hernia:  None appreciated  Skin  Inspection:  Grossly normal   Breasts: Examined lying and sitting.     Right: Without masses, retractions, discharge or axillary adenopathy.     Left: Without masses, retractions, discharge or axillary adenopathy. Gentitourinary   Inguinal/mons:  Normal without inguinal adenopathy  External genitalia:  Normal  BUS/Urethra/Skene's glands:  Normal  Vagina:  Normal  Cervix:  Normal  Uterus:   normal in size, shape and contour.  Midline and mobile  Adnexa/parametria:     Rt: Without masses or tenderness.   Lt: Without masses or tenderness.  Anus and perineum: Normal    Assessment/Plan:  23 y.o. DWF G0 for annual exam with no complaints.  Monthly cycle/menorrhagia/dysmenorrhea 2014 Pap ascus with negative HR HPV  Plan: Options  reviewed, Loestrin 1/20 prescription, proper use given and reviewed risks of blood clots, strokes. Condoms encouraged until permanent partner. SBE's, regular exercise, decrease calories for weight loss, calcium rich diet, MVI daily encouraged. CBC, glucose, Pap.  Huel Cote [facility], 4:23 PM 02/26/2014

## 2014-02-26 NOTE — Patient Instructions (Signed)

## 2014-02-27 LAB — URINALYSIS W MICROSCOPIC + REFLEX CULTURE
Bacteria, UA: NONE SEEN
Bilirubin Urine: NEGATIVE
CASTS: NONE SEEN
CRYSTALS: NONE SEEN
GLUCOSE, UA: NEGATIVE mg/dL
HGB URINE DIPSTICK: NEGATIVE
Ketones, ur: NEGATIVE mg/dL
LEUKOCYTES UA: NEGATIVE
Nitrite: NEGATIVE
PH: 6.5 (ref 5.0–8.0)
Protein, ur: NEGATIVE mg/dL
SQUAMOUS EPITHELIAL / LPF: NONE SEEN
Specific Gravity, Urine: 1.011 (ref 1.005–1.030)
Urobilinogen, UA: 0.2 mg/dL (ref 0.0–1.0)

## 2014-02-27 LAB — GLUCOSE, RANDOM: Glucose, Bld: 87 mg/dL (ref 70–99)

## 2014-02-28 LAB — CYTOLOGY - PAP

## 2014-03-26 ENCOUNTER — Other Ambulatory Visit: Payer: Self-pay

## 2014-03-26 DIAGNOSIS — Z3041 Encounter for surveillance of contraceptive pills: Secondary | ICD-10-CM

## 2014-03-26 MED ORDER — NORETHINDRONE ACET-ETHINYL EST 1-20 MG-MCG PO TABS: 1.0000 | ORAL_TABLET | Freq: Every day | ORAL | Status: AC

## 2015-03-04 ENCOUNTER — Encounter: Payer: 59 | Admitting: Women's Health

## 2015-03-31 ENCOUNTER — Ambulatory Visit (INDEPENDENT_AMBULATORY_CARE_PROVIDER_SITE_OTHER): Payer: 59 | Admitting: Women's Health

## 2015-03-31 ENCOUNTER — Encounter: Payer: Self-pay | Admitting: Women's Health

## 2015-03-31 VITALS — BP 126/80 | Ht 65.0 in | Wt 197.0 lb

## 2015-03-31 DIAGNOSIS — Z01419 Encounter for gynecological examination (general) (routine) without abnormal findings: Secondary | ICD-10-CM

## 2015-03-31 DIAGNOSIS — Z3041 Encounter for surveillance of contraceptive pills: Secondary | ICD-10-CM

## 2015-03-31 LAB — CBC WITH DIFFERENTIAL/PLATELET
BASOS ABS: 0.1 10*3/uL (ref 0.0–0.1)
BASOS PCT: 1 % (ref 0–1)
EOS ABS: 0.1 10*3/uL (ref 0.0–0.7)
Eosinophils Relative: 1 % (ref 0–5)
HCT: 39.9 % (ref 36.0–46.0)
Hemoglobin: 13.3 g/dL (ref 12.0–15.0)
Lymphocytes Relative: 34 % (ref 12–46)
Lymphs Abs: 2.2 10*3/uL (ref 0.7–4.0)
MCH: 29.2 pg (ref 26.0–34.0)
MCHC: 33.3 g/dL (ref 30.0–36.0)
MCV: 87.7 fL (ref 78.0–100.0)
MPV: 9.4 fL (ref 8.6–12.4)
Monocytes Absolute: 0.5 10*3/uL (ref 0.1–1.0)
Monocytes Relative: 8 % (ref 3–12)
NEUTROS PCT: 56 % (ref 43–77)
Neutro Abs: 3.6 10*3/uL (ref 1.7–7.7)
Platelets: 299 10*3/uL (ref 150–400)
RBC: 4.55 MIL/uL (ref 3.87–5.11)
RDW: 13 % (ref 11.5–15.5)
WBC: 6.5 10*3/uL (ref 4.0–10.5)

## 2015-03-31 LAB — GLUCOSE, RANDOM: GLUCOSE: 95 mg/dL (ref 65–99)

## 2015-03-31 MED ORDER — NORGESTIMATE-ETH ESTRADIOL 0.25-35 MG-MCG PO TABS: 1.0000 | ORAL_TABLET | Freq: Every day | ORAL | Status: DC

## 2015-03-31 NOTE — Progress Notes (Signed)
Yesenia Scott [birth year] [id number]    History:    Presents for annual exam.  Regular monthly cycle/lesbian on Sprintec for dysmenorrhea. Same partner. Gardasil series completed. Normal Pap history.  Past medical history, past surgical history, family history and social history were all reviewed and documented in the EPIC chart. School and work. Mother diabetes and hypertension.  ROS:  A ROS was performed and pertinent positives and negatives are included.  Exam:  Filed Vitals:   03/31/15 0818  BP: 126/80    General appearance:  Normal Thyroid:  Symmetrical, normal in size, without palpable masses or nodularity. Respiratory  Auscultation:  Clear without wheezing or rhonchi Cardiovascular  Auscultation:  Regular rate, without rubs, murmurs or gallops  Edema/varicosities:  Not grossly evident Abdominal  Soft,nontender, without masses, guarding or rebound.  Liver/spleen:  No organomegaly noted  Hernia:  None appreciated  Skin  Inspection:  Grossly normal   Breasts: Examined lying and sitting.     Right: Without masses, retractions, discharge or axillary adenopathy.     Left: Without masses, retractions, discharge or axillary adenopathy. Gentitourinary   Inguinal/mons:  Normal without inguinal adenopathy  External genitalia:  Normal  BUS/Urethra/Skene's glands:  Normal  Vagina:  Normal  Cervix:  Normal  Uterus:  normal in size, shape and contour.  Midline and mobile  Adnexa/parametria:     Rt: Without masses or tenderness.   Lt: Without masses or tenderness.  Anus and perineum: Normal   Assessment/Plan:  24 y.o. D WF G0 for annual exam with no complaints.  Monthly cycle/on Sprintec good relief of menorrhagia and dysmenorrhea  Lesbian Obesity  Plan: Sprintec prescription, proper use, slight risk for blood clots and strokes reviewed. Reviewed importance of decreasing calories and increasing exercise for weight loss. SBE's, calcium rich diet, MVI daily  encouraged. CBC, glucose, UA, Pap normal 2014, new screening guidelines reviewed.  Ted Leonhart J WHNP, 1:40 PM 03/31/2015

## 2015-03-31 NOTE — Patient Instructions (Signed)
Health Maintenance, Female Adopting a healthy lifestyle and getting preventive care can go a long way to promote health and wellness. Talk with your health care provider about what schedule of regular examinations is right for you. This is a good chance for you to check in with your provider about disease prevention and staying healthy. In between checkups, there are plenty of things you can do on your own. Experts have done a lot of research about which lifestyle changes and preventive measures are most likely to keep you healthy. Ask your health care provider for more information. WEIGHT AND DIET  Eat a healthy diet  Be sure to include plenty of vegetables, fruits, low-fat dairy products, and lean protein.  Do not eat a lot of foods high in solid fats, added sugars, or salt.  Get regular exercise. This is one of the most important things you can do for your health.  Most adults should exercise for at least 150 minutes each week. The exercise should increase your heart rate and make you sweat (moderate-intensity exercise).  Most adults should also do strengthening exercises at least twice a week. This is in addition to the moderate-intensity exercise.  Maintain a healthy weight  Body mass index (BMI) is a measurement that can be used to identify possible weight problems. It estimates body fat based on height and weight. Your health care provider can help determine your BMI and help you achieve or maintain a healthy weight.  For females 20 years of age and older:   A BMI below 18.5 is considered underweight.  A BMI of 18.5 to 24.9 is normal.  A BMI of 25 to 29.9 is considered overweight.  A BMI of 30 and above is considered obese.  Watch levels of cholesterol and blood lipids  You should start having your blood tested for lipids and cholesterol at 23 years of age, then have this test every 5 years.  You may need to have your cholesterol levels checked more often if:  Your lipid  or cholesterol levels are high.  You are older than 23 years of age.  You are at high risk for heart disease.  CANCER SCREENING   Lung Cancer  Lung cancer screening is recommended for adults 55-80 years old who are at high risk for lung cancer because of a history of smoking.  A yearly low-dose CT scan of the lungs is recommended for people who:  Currently smoke.  Have quit within the past 15 years.  Have at least a 30-pack-year history of smoking. A pack year is smoking an average of one pack of cigarettes a day for 1 year.  Yearly screening should continue until it has been 15 years since you quit.  Yearly screening should stop if you develop a health problem that would prevent you from having lung cancer treatment.  Breast Cancer  Practice breast self-awareness. This means understanding how your breasts normally appear and feel.  It also means doing regular breast self-exams. Let your health care provider know about any changes, no matter how small.  If you are in your 20s or 30s, you should have a clinical breast exam (CBE) by a health care provider every 1-3 years as part of a regular health exam.  If you are 40 or older, have a CBE every year. Also consider having a breast X-ray (mammogram) every year.  If you have a family history of breast cancer, talk to your health care provider about genetic screening.  If you   are at high risk for breast cancer, talk to your health care provider about having an MRI and a mammogram every year.  Breast cancer gene (BRCA) assessment is recommended for women who have family members with BRCA-related cancers. BRCA-related cancers include:  Breast.  Ovarian.  Tubal.  Peritoneal cancers.  Results of the assessment will determine the need for genetic counseling and BRCA1 and BRCA2 testing. Cervical Cancer Your health care provider may recommend that you be screened regularly for cancer of the pelvic organs (ovaries, uterus, and  vagina). This screening involves a pelvic examination, including checking for microscopic changes to the surface of your cervix (Pap test). You may be encouraged to have this screening done every 3 years, beginning at age 21.  For women ages 30-65, health care providers may recommend pelvic exams and Pap testing every 3 years, or they may recommend the Pap and pelvic exam, combined with testing for human papilloma virus (HPV), every 5 years. Some types of HPV increase your risk of cervical cancer. Testing for HPV may also be done on women of any age with unclear Pap test results.  Other health care providers may not recommend any screening for nonpregnant women who are considered low risk for pelvic cancer and who do not have symptoms. Ask your health care provider if a screening pelvic exam is right for you.  If you have had past treatment for cervical cancer or a condition that could lead to cancer, you need Pap tests and screening for cancer for at least 20 years after your treatment. If Pap tests have been discontinued, your risk factors (such as having a new sexual partner) need to be reassessed to determine if screening should resume. Some women have medical problems that increase the chance of getting cervical cancer. In these cases, your health care provider may recommend more frequent screening and Pap tests. Colorectal Cancer  This type of cancer can be detected and often prevented.  Routine colorectal cancer screening usually begins at 23 years of age and continues through 23 years of age.  Your health care provider may recommend screening at an earlier age if you have risk factors for colon cancer.  Your health care provider may also recommend using home test kits to check for hidden blood in the stool.  A small camera at the end of a tube can be used to examine your colon directly (sigmoidoscopy or colonoscopy). This is done to check for the earliest forms of colorectal  cancer.  Routine screening usually begins at age 50.  Direct examination of the colon should be repeated every 5-10 years through 23 years of age. However, you may need to be screened more often if early forms of precancerous polyps or small growths are found. Skin Cancer  Check your skin from head to toe regularly.  Tell your health care provider about any new moles or changes in moles, especially if there is a change in a mole's shape or color.  Also tell your health care provider if you have a mole that is larger than the size of a pencil eraser.  Always use sunscreen. Apply sunscreen liberally and repeatedly throughout the day.  Protect yourself by wearing long sleeves, pants, a wide-brimmed hat, and sunglasses whenever you are outside. HEART DISEASE, DIABETES, AND HIGH BLOOD PRESSURE   High blood pressure causes heart disease and increases the risk of stroke. High blood pressure is more likely to develop in:  People who have blood pressure in the high end   of the normal range (130-139/85-89 mm Hg).  People who are overweight or obese.  People who are African American.  If you are 38-23 years of age, have your blood pressure checked every 3-5 years. If you are 61 years of age or older, have your blood pressure checked every year. You should have your blood pressure measured twice--once when you are at a hospital or clinic, and once when you are not at a hospital or clinic. Record the average of the two measurements. To check your blood pressure when you are not at a hospital or clinic, you can use:  An automated blood pressure machine at a pharmacy.  A home blood pressure monitor.  If you are between 45 years and 39 years old, ask your health care provider if you should take aspirin to prevent strokes.  Have regular diabetes screenings. This involves taking a blood sample to check your fasting blood sugar level.  If you are at a normal weight and have a low risk for diabetes,  have this test once every three years after 23 years of age.  If you are overweight and have a high risk for diabetes, consider being tested at a younger age or more often. PREVENTING INFECTION  Hepatitis B  If you have a higher risk for hepatitis B, you should be screened for this virus. You are considered at high risk for hepatitis B if:  You were born in a country where hepatitis B is common. Ask your health care provider which countries are considered high risk.  Your parents were born in a high-risk country, and you have not been immunized against hepatitis B (hepatitis B vaccine).  You have HIV or AIDS.  You use needles to inject street drugs.  You live with someone who has hepatitis B.  You have had sex with someone who has hepatitis B.  You get hemodialysis treatment.  You take certain medicines for conditions, including cancer, organ transplantation, and autoimmune conditions. Hepatitis C  Blood testing is recommended for:  Everyone born from [birth year] through 1964.  Anyone with known risk factors for hepatitis C. Sexually transmitted infections (STIs)  You should be screened for sexually transmitted infections (STIs) including gonorrhea and chlamydia if:  You are sexually active and are younger than 24 years of age.  You are older than 23 years of age and your health care provider tells you that you are at risk for this type of infection.  Your sexual activity has changed since you were last screened and you are at an increased risk for chlamydia or gonorrhea. Ask your health care provider if you are at risk.  If you do not have HIV, but are at risk, it may be recommended that you take a prescription medicine daily to prevent HIV infection. This is called pre-exposure prophylaxis (PrEP). You are considered at risk if:  You are sexually active and do not regularly use condoms or know the HIV status of your partner(s).  You take drugs by injection.  You are sexually  active with a partner who has HIV. Talk with your health care provider about whether you are at high risk of being infected with HIV. If you choose to begin PrEP, you should first be tested for HIV. You should then be tested every 3 months for as long as you are taking PrEP.  PREGNANCY   If you are premenopausal and you may become pregnant, ask your health care provider about preconception counseling.  If you may  become pregnant, take 400 to 800 micrograms (mcg) of folic acid every day.  If you want to prevent pregnancy, talk to your health care provider about birth control (contraception). OSTEOPOROSIS AND MENOPAUSE   Osteoporosis is a disease in which the bones lose minerals and strength with aging. This can result in serious bone fractures. Your risk for osteoporosis can be identified using a bone density scan.  If you are 61 years of age or older, or if you are at risk for osteoporosis and fractures, ask your health care provider if you should be screened.  Ask your health care provider whether you should take a calcium or vitamin D supplement to lower your risk for osteoporosis.  Menopause may have certain physical symptoms and risks.  Hormone replacement therapy may reduce some of these symptoms and risks. Talk to your health care provider about whether hormone replacement therapy is right for you.  HOME CARE INSTRUCTIONS   Schedule regular health, dental, and eye exams.  Stay current with your immunizations.   Do not use any tobacco products including cigarettes, chewing tobacco, or electronic cigarettes.  If you are pregnant, do not drink alcohol.  If you are breastfeeding, limit how much and how often you drink alcohol.  Limit alcohol intake to no more than 1 drink per day for nonpregnant women. One drink equals 12 ounces of beer, 5 ounces of wine, or 1 ounces of hard liquor.  Do not use street drugs.  Do not share needles.  Ask your health care provider for help if  you need support or information about quitting drugs.  Tell your health care provider if you often feel depressed.  Tell your health care provider if you have ever been abused or do not feel safe at home.   This information is not intended to replace advice given to you by your health care provider. Make sure you discuss any questions you have with your health care provider.   Document Released: 11/01/2010 Document Revised: 05/09/2014 Document Reviewed: 03/20/2013 Elsevier Interactive Patient Education [employer].

## 2015-04-01 LAB — URINALYSIS W MICROSCOPIC + REFLEX CULTURE
Bacteria, UA: NONE SEEN [HPF]
Bilirubin Urine: NEGATIVE
Casts: NONE SEEN [LPF]
Crystals: NONE SEEN [HPF]
GLUCOSE, UA: NEGATIVE
Hgb urine dipstick: NEGATIVE
Ketones, ur: NEGATIVE
LEUKOCYTES UA: NEGATIVE
Nitrite: NEGATIVE
PROTEIN: NEGATIVE
RBC / HPF: NONE SEEN RBC/HPF (ref <=2)
SPECIFIC GRAVITY, URINE: 1.02 (ref 1.001–1.035)
Yeast: NONE SEEN [HPF]
pH: 6.5 (ref 5.0–8.0)

## 2015-04-03 LAB — URINE CULTURE: Colony Count: 15000

## 2015-04-08 ENCOUNTER — Other Ambulatory Visit: Payer: Self-pay

## 2015-04-08 DIAGNOSIS — Z3041 Encounter for surveillance of contraceptive pills: Secondary | ICD-10-CM

## 2015-04-08 MED ORDER — NORGESTIMATE-ETH ESTRADIOL 0.25-35 MG-MCG PO TABS: 1.0000 | ORAL_TABLET | Freq: Every day | ORAL | Status: DC

## 2015-05-03 DIAGNOSIS — K8020 Calculus of gallbladder without cholecystitis without obstruction: Secondary | ICD-10-CM

## 2015-05-03 DIAGNOSIS — K819 Cholecystitis, unspecified: Secondary | ICD-10-CM

## 2015-05-03 HISTORY — DX: Calculus of gallbladder without cholecystitis without obstruction: K80.20

## 2015-05-03 HISTORY — DX: Cholecystitis, unspecified: K81.9

## 2015-06-02 ENCOUNTER — Encounter (HOSPITAL_BASED_OUTPATIENT_CLINIC_OR_DEPARTMENT_OTHER): Payer: Self-pay | Admitting: *Deleted

## 2015-06-02 ENCOUNTER — Other Ambulatory Visit: Payer: Self-pay | Admitting: Surgery

## 2015-06-02 NOTE — H&P (Signed)
Yesenia Scott 06/02/2015 2:53 PM Location: [facility] Patient #: [id number] DOB: Apr 27, 1991 Single / Language: Yesenia Scott / Race: White Female   History of Present Illness (Damante Spragg A. Ninfa Linden MD; 06/02/2015 3:28 PM) Patient words: New-Gallbladder.  The patient is a 25 year old female who presents for evaluation of gall stones. This is a pleasant female referred by Dr. Sharilyn Sites for abdominal pain. She has been having epigastric and right upper quadrant abdominal pain for a year. She originally thought it was just gas pain but it became after fatty meals. She has been now having nausea and vomiting intermittently and the pain every day which she described as sharp and severe. She is now limited to only eating broth. She denies jaundice. She has been having loose bowel movements. She is otherwise without complaints.   Other Problems Malachi Bonds, CMA; 06/02/2015 2:53 PM) Back Pain Cholelithiasis  Past Surgical History Malachi Bonds, CMA; 06/02/2015 2:53 PM) Foot Surgery Bilateral.  Diagnostic Studies History Malachi Bonds, CMA; 06/02/2015 2:53 PM) Colonoscopy never Mammogram never Pap Smear 1-5 years ago  Allergies Malachi Bonds, CMA; 06/02/2015 2:54 PM) Cephalexin *CEPHALOSPORINS*  Medication History Malachi Bonds, CMA; 06/02/2015 2:54 PM) Ortho-Cyclen (28) (0.25-35MG -MCG Tablet, Oral) Active. Pantoprazole Sodium (40MG  Tablet DR, Oral) Active. Align (Oral) Active. Tylenol (500MG  Capsule, Oral) Active. Medications Reconciled  Social History Malachi Bonds, CMA; 06/02/2015 2:53 PM) Caffeine use Tea.  Family History Malachi Bonds, CMA; 06/02/2015 2:53 PM) Alcohol Abuse Father. Cancer Father. Diabetes Mellitus Mother. Heart Disease Father. Hypertension Mother, Sister. Respiratory Condition Father.  Pregnancy / Birth History Malachi Bonds, CMA; 06/02/2015 2:53 PM) Age at menarche 77 years. Contraceptive History Oral  contraceptives. Gravida 0 Para 0 Regular periods    Review of Systems (Chemira Jones CMA; 06/02/2015 2:53 PM) General Present- Appetite Loss, Fatigue and Night Sweats. Not Present- Chills, Fever, Weight Gain and Weight Loss. Skin Present- Dryness. Not Present- Change in Wart/Mole, Hives, Jaundice, New Lesions, Non-Healing Wounds, Rash and Ulcer. HEENT Not Present- Earache, Hearing Loss, Hoarseness, Nose Bleed, Oral Ulcers, Ringing in the Ears, Seasonal Allergies, Sinus Pain, Sore Throat, Visual Disturbances, Wears glasses/contact lenses and Yellow Eyes. Cardiovascular Not Present- Chest Pain, Difficulty Breathing Lying Down, Leg Cramps, Palpitations, Rapid Heart Rate, Shortness of Breath and Swelling of Extremities. Gastrointestinal Present- Abdominal Pain, Bloating, Change in Bowel Habits, Chronic diarrhea, Constipation, Excessive gas, Gets full quickly at meals, Indigestion, Nausea and Vomiting. Not Present- Bloody Stool, Difficulty Swallowing, Hemorrhoids and Rectal Pain. Musculoskeletal Present- Back Pain. Not Present- Joint Pain, Joint Stiffness, Muscle Pain, Muscle Weakness and Swelling of Extremities. Neurological Not Present- Decreased Memory, Fainting, Headaches, Numbness, Seizures, Tingling, Tremor, Trouble walking and Weakness. Psychiatric Present- Anxiety and Change in Sleep Pattern. Not Present- Bipolar, Depression, Fearful and Frequent crying. Endocrine Not Present- Cold Intolerance, Excessive Hunger, Hair Changes, Heat Intolerance, Hot flashes and New Diabetes. Hematology Not Present- Easy Bruising, Excessive bleeding, Gland problems, HIV and Persistent Infections.  Vitals (Chemira Jones CMA; 06/02/2015 2:53 PM) 06/02/2015 2:53 PM Weight: 190 lb Height: 65.5in Body Surface Area: 1.95 m Body Mass Index: 31.14 kg/m  Temp.: 98.73F(Oral)  Pulse: 78 (Regular)  BP: 114/74 (Sitting, Left Arm, Standard)       Physical Exam (Alexandrina Fiorini A. Ninfa Linden MD; 06/02/2015 3:28  PM) General Mental Status-Alert. General Appearance-Consistent with stated age. Hydration-Well hydrated. Voice-Normal.  Head and Neck Head-normocephalic, atraumatic with no lesions or palpable masses.  Eye Eyeball - Bilateral-Extraocular movements intact. Sclera/Conjunctiva - Bilateral-No scleral icterus.  Chest and Lung Exam Chest and lung exam  reveals -quiet, even and easy respiratory effort with no use of accessory muscles and on auscultation, normal breath sounds, no adventitious sounds and normal vocal resonance. Inspection Chest Wall - Normal. Back - normal.  Cardiovascular Cardiovascular examination reveals -on palpation PMI is normal in location and amplitude, no palpable S3 or S4. Normal cardiac borders., normal heart sounds, regular rate and rhythm with no murmurs, carotid auscultation reveals no bruits and normal pedal pulses bilaterally.  Abdomen Inspection Inspection of the abdomen reveals - No Hernias. Skin - Scar - no surgical scars. Palpation/Percussion Palpation and Percussion of the abdomen reveal - Soft, No Rebound tenderness, No Rigidity (guarding) and No hepatosplenomegaly. Tenderness - Right Upper Quadrant. Auscultation Auscultation of the abdomen reveals - Bowel sounds normal.  Neurologic Neurologic evaluation reveals -alert and oriented x 3 with no impairment of recent or remote memory. Mental Status-Normal.  Musculoskeletal Normal Exam - Left-Upper Extremity Strength Normal and Lower Extremity Strength Normal. Normal Exam - Right-Upper Extremity Strength Normal, Lower Extremity Weakness.    Assessment & Plan (Dajha Urquilla A. Ninfa Linden MD; 06/02/2015 3:30 PM) CHOLELITHIASIS WITH CHOLECYSTITIS (K80.10) Impression: I reviewed her ultrasound showing gallstones and a thickened gallbladder wall. I suspect she has cholecystitis as well. Laparoscopic cholecystectomy is strongly recommended and she is here to proceed with surgery. I gave  her literature regarding surgery. I discussed the risk which includes but is not limited to bleeding, infection, bile duct injury, bile leak, injury to other structures, the need to convert to an open procedure, cardiopulmonary issues, postoperative recovery, etc. Surgery will be scheduled urgently Current Plans Started Percocet 5-325MG , 1 - 2 Tablet every four hours, as needed, #40, 06/02/2015, No Refill. Pt Education - Pamphlet Given - Laparoscopic Gallbladder Surgery: discussed with patient and provided information.   Signed by Harl Bowie, MD (06/02/2015 3:30 PM)

## 2015-06-03 ENCOUNTER — Encounter (HOSPITAL_BASED_OUTPATIENT_CLINIC_OR_DEPARTMENT_OTHER)
Admission: RE | Disposition: A | Discharge status: Home / Self Care | Payer: Self-pay | Source: Ambulatory Visit | Attending: Surgery

## 2015-06-03 ENCOUNTER — Ambulatory Visit (HOSPITAL_BASED_OUTPATIENT_CLINIC_OR_DEPARTMENT_OTHER): Payer: 59 | Admitting: Anesthesiology

## 2015-06-03 ENCOUNTER — Ambulatory Visit (HOSPITAL_BASED_OUTPATIENT_CLINIC_OR_DEPARTMENT_OTHER)
Admission: RE | Admit: 2015-06-03 | Discharge: 2015-06-03 | Disposition: A | Discharge status: Home / Self Care | Payer: 59 | Source: Ambulatory Visit | Attending: Surgery | Admitting: Surgery

## 2015-06-03 ENCOUNTER — Encounter (HOSPITAL_BASED_OUTPATIENT_CLINIC_OR_DEPARTMENT_OTHER): Payer: Self-pay | Admitting: *Deleted

## 2015-06-03 DIAGNOSIS — Z793 Long term (current) use of hormonal contraceptives: Secondary | ICD-10-CM | POA: Insufficient documentation

## 2015-06-03 DIAGNOSIS — K8020 Calculus of gallbladder without cholecystitis without obstruction: Secondary | ICD-10-CM | POA: Diagnosis present

## 2015-06-03 DIAGNOSIS — K8000 Calculus of gallbladder with acute cholecystitis without obstruction: Secondary | ICD-10-CM | POA: Diagnosis not present

## 2015-06-03 DIAGNOSIS — Z87891 Personal history of nicotine dependence: Secondary | ICD-10-CM | POA: Diagnosis not present

## 2015-06-03 HISTORY — DX: Cholecystitis, unspecified: K81.9

## 2015-06-03 HISTORY — DX: Calculus of gallbladder without cholecystitis without obstruction: K80.20

## 2015-06-03 HISTORY — PX: CHOLECYSTECTOMY: SHX55

## 2015-06-03 SURGERY — LAPAROSCOPIC CHOLECYSTECTOMY
Anesthesia: General | Site: Abdomen

## 2015-06-03 MED ORDER — CIPROFLOXACIN IN D5W 400 MG/200ML IV SOLN
INTRAVENOUS | Status: AC
  Filled 2015-06-03: qty 200

## 2015-06-03 MED ORDER — FENTANYL CITRATE (PF) 100 MCG/2ML IJ SOLN
50.0000 ug | INTRAMUSCULAR | Status: DC | PRN
  Administered 2015-06-03: 50 ug via INTRAVENOUS
  Administered 2015-06-03: 100 ug via INTRAVENOUS

## 2015-06-03 MED ORDER — KETOROLAC TROMETHAMINE 30 MG/ML IJ SOLN
INTRAMUSCULAR | Status: AC
  Filled 2015-06-03: qty 1

## 2015-06-03 MED ORDER — SODIUM CHLORIDE 0.9 % IV SOLN: 250.0000 mL | INTRAVENOUS | Status: DC | PRN

## 2015-06-03 MED ORDER — ACETAMINOPHEN 650 MG RE SUPP: 650.0000 mg | RECTAL | Status: DC | PRN

## 2015-06-03 MED ORDER — FENTANYL CITRATE (PF) 100 MCG/2ML IJ SOLN
INTRAMUSCULAR | Status: AC
  Filled 2015-06-03: qty 2

## 2015-06-03 MED ORDER — ONDANSETRON HCL 4 MG/2ML IJ SOLN
INTRAMUSCULAR | Status: DC | PRN
  Administered 2015-06-03: 4 mg via INTRAVENOUS

## 2015-06-03 MED ORDER — KETOROLAC TROMETHAMINE 30 MG/ML IJ SOLN
INTRAMUSCULAR | Status: DC | PRN
  Administered 2015-06-03: 30 mg via INTRAVENOUS

## 2015-06-03 MED ORDER — HYDROMORPHONE HCL 1 MG/ML IJ SOLN
INTRAMUSCULAR | Status: AC
  Filled 2015-06-03: qty 1

## 2015-06-03 MED ORDER — DEXAMETHASONE SODIUM PHOSPHATE 4 MG/ML IJ SOLN
INTRAMUSCULAR | Status: DC | PRN
  Administered 2015-06-03: 10 mg via INTRAVENOUS

## 2015-06-03 MED ORDER — MEPERIDINE HCL 25 MG/ML IJ SOLN: 6.2500 mg | INTRAMUSCULAR | Status: DC | PRN

## 2015-06-03 MED ORDER — PROPOFOL 10 MG/ML IV BOLUS
INTRAVENOUS | Status: DC | PRN
  Administered 2015-06-03: 250 mg via INTRAVENOUS

## 2015-06-03 MED ORDER — MORPHINE SULFATE (PF) 2 MG/ML IV SOLN: 1.0000 mg | INTRAVENOUS | Status: DC | PRN

## 2015-06-03 MED ORDER — LIDOCAINE HCL (CARDIAC) 20 MG/ML IV SOLN
INTRAVENOUS | Status: DC | PRN
  Administered 2015-06-03: 80 mg via INTRAVENOUS

## 2015-06-03 MED ORDER — ONDANSETRON HCL 4 MG/2ML IJ SOLN
INTRAMUSCULAR | Status: AC
  Filled 2015-06-03: qty 2

## 2015-06-03 MED ORDER — OXYCODONE HCL 5 MG PO TABS
5.0000 mg | ORAL_TABLET | Freq: Once | ORAL | Status: AC | PRN
  Administered 2015-06-03: 5 mg via ORAL

## 2015-06-03 MED ORDER — ROCURONIUM BROMIDE 50 MG/5ML IV SOLN
INTRAVENOUS | Status: AC
  Filled 2015-06-03: qty 1

## 2015-06-03 MED ORDER — SUCCINYLCHOLINE CHLORIDE 20 MG/ML IJ SOLN
INTRAMUSCULAR | Status: AC
  Filled 2015-06-03: qty 1

## 2015-06-03 MED ORDER — MIDAZOLAM HCL 2 MG/2ML IJ SOLN
INTRAMUSCULAR | Status: AC
  Filled 2015-06-03: qty 2

## 2015-06-03 MED ORDER — SODIUM CHLORIDE 0.9% FLUSH: 3.0000 mL | Freq: Two times a day (BID) | INTRAVENOUS | Status: DC

## 2015-06-03 MED ORDER — ROCURONIUM 10MG/ML (10ML) SYRINGE FOR MEDFUSION PUMP - OPTIME
INTRAVENOUS | Status: DC | PRN
  Administered 2015-06-03: 20 mg via INTRAVENOUS

## 2015-06-03 MED ORDER — OXYCODONE HCL 5 MG/5ML PO SOLN: 5.0000 mg | Freq: Once | ORAL | Status: AC | PRN

## 2015-06-03 MED ORDER — DEXAMETHASONE SODIUM PHOSPHATE 10 MG/ML IJ SOLN
INTRAMUSCULAR | Status: AC
  Filled 2015-06-03: qty 1

## 2015-06-03 MED ORDER — CIPROFLOXACIN IN D5W 400 MG/200ML IV SOLN
400.0000 mg | INTRAVENOUS | Status: AC
  Administered 2015-06-03: 400 mg via INTRAVENOUS

## 2015-06-03 MED ORDER — HYDROMORPHONE HCL 1 MG/ML IJ SOLN
0.2500 mg | INTRAMUSCULAR | Status: DC | PRN
  Administered 2015-06-03 (×4): 0.5 mg via INTRAVENOUS

## 2015-06-03 MED ORDER — SODIUM CHLORIDE 0.9% FLUSH: 3.0000 mL | INTRAVENOUS | Status: DC | PRN

## 2015-06-03 MED ORDER — SODIUM CHLORIDE 0.9 % IR SOLN
Status: DC | PRN
  Administered 2015-06-03: 1000 mL

## 2015-06-03 MED ORDER — LIDOCAINE HCL (CARDIAC) 20 MG/ML IV SOLN
INTRAVENOUS | Status: AC
  Filled 2015-06-03: qty 5

## 2015-06-03 MED ORDER — SUGAMMADEX SODIUM 200 MG/2ML IV SOLN
INTRAVENOUS | Status: DC | PRN
  Administered 2015-06-03: 200 mg via INTRAVENOUS

## 2015-06-03 MED ORDER — SUGAMMADEX SODIUM 200 MG/2ML IV SOLN
INTRAVENOUS | Status: AC
  Filled 2015-06-03: qty 2

## 2015-06-03 MED ORDER — SCOPOLAMINE 1 MG/3DAYS TD PT72
MEDICATED_PATCH | TRANSDERMAL | Status: AC
  Filled 2015-06-03: qty 1

## 2015-06-03 MED ORDER — OXYCODONE HCL 5 MG PO TABS
ORAL_TABLET | ORAL | Status: AC
  Filled 2015-06-03: qty 1

## 2015-06-03 MED ORDER — GLYCOPYRROLATE 0.2 MG/ML IJ SOLN: 0.2000 mg | Freq: Once | INTRAMUSCULAR | Status: DC | PRN

## 2015-06-03 MED ORDER — MIDAZOLAM HCL 2 MG/2ML IJ SOLN
1.0000 mg | INTRAMUSCULAR | Status: DC | PRN
  Administered 2015-06-03: 2 mg via INTRAVENOUS

## 2015-06-03 MED ORDER — ACETAMINOPHEN 325 MG PO TABS: 650.0000 mg | ORAL_TABLET | ORAL | Status: DC | PRN

## 2015-06-03 MED ORDER — OXYCODONE HCL 5 MG PO TABS: 5.0000 mg | ORAL_TABLET | ORAL | Status: DC | PRN

## 2015-06-03 MED ORDER — SUCCINYLCHOLINE CHLORIDE 20 MG/ML IJ SOLN
INTRAMUSCULAR | Status: DC | PRN
  Administered 2015-06-03: 100 mg via INTRAVENOUS

## 2015-06-03 MED ORDER — LACTATED RINGERS IV SOLN
INTRAVENOUS | Status: DC
  Administered 2015-06-03 (×2): via INTRAVENOUS

## 2015-06-03 MED ORDER — SCOPOLAMINE 1 MG/3DAYS TD PT72
1.0000 | MEDICATED_PATCH | Freq: Once | TRANSDERMAL | Status: DC | PRN
  Administered 2015-06-03: 1.5 mg via TRANSDERMAL

## 2015-06-03 MED ORDER — BUPIVACAINE-EPINEPHRINE (PF) 0.5% -1:200000 IJ SOLN
INTRAMUSCULAR | Status: DC | PRN
  Administered 2015-06-03: 20 mL

## 2015-06-03 SURGICAL SUPPLY — 38 items
APPLIER CLIP 5 13 M/L LIGAMAX5 (MISCELLANEOUS) ×2
APR CLP MED LRG 5 ANG JAW (MISCELLANEOUS) ×1
BAG SPEC RTRVL LRG 6X4 10 (ENDOMECHANICALS) ×1
BLADE CLIPPER SURG (BLADE) IMPLANT
CHLORAPREP W/TINT 26ML (MISCELLANEOUS) ×2 IMPLANT
CLIP APPLIE 5 13 M/L LIGAMAX5 (MISCELLANEOUS) ×1 IMPLANT
COVER MAYO STAND STRL (DRAPES) IMPLANT
DECANTER SPIKE VIAL GLASS SM (MISCELLANEOUS) IMPLANT
DRAPE C-ARM 42X72 X-RAY (DRAPES) IMPLANT
DRAPE LAPAROSCOPIC ABDOMINAL (DRAPES) ×1 IMPLANT
ELECT REM PT RETURN 9FT ADLT (ELECTROSURGICAL) ×2
ELECTRODE REM PT RTRN 9FT ADLT (ELECTROSURGICAL) ×1 IMPLANT
FILTER SMOKE EVAC LAPAROSHD (FILTER) IMPLANT
GLOVE BIO SURGEON STRL SZ 6.5 (GLOVE) ×1 IMPLANT
GLOVE BIOGEL PI IND STRL 7.0 (GLOVE) IMPLANT
GLOVE BIOGEL PI INDICATOR 7.0 (GLOVE) ×1
GLOVE SURG SIGNA 7.5 PF LTX (GLOVE) ×2 IMPLANT
GLOVE SURG SS PI 6.5 STRL IVOR (GLOVE) ×2 IMPLANT
GOWN STRL REUS W/ TWL LRG LVL3 (GOWN DISPOSABLE) ×3 IMPLANT
GOWN STRL REUS W/TWL LRG LVL3 (GOWN DISPOSABLE) ×6
LIQUID BAND (GAUZE/BANDAGES/DRESSINGS) ×3 IMPLANT
NS IRRIG 1000ML POUR BTL (IV SOLUTION) ×2 IMPLANT
PACK BASIN DAY SURGERY FS (CUSTOM PROCEDURE TRAY) ×2 IMPLANT
POUCH SPECIMEN RETRIEVAL 10MM (ENDOMECHANICALS) ×1 IMPLANT
SCISSORS LAP 5X35 DISP (ENDOMECHANICALS) IMPLANT
SET CHOLANGIOGRAPH 5 50 .035 (SET/KITS/TRAYS/PACK) IMPLANT
SET IRRIG TUBING LAPAROSCOPIC (IRRIGATION / IRRIGATOR) ×2 IMPLANT
SLEEVE ENDOPATH XCEL 5M (ENDOMECHANICALS) ×4 IMPLANT
SLEEVE SCD COMPRESS KNEE MED (MISCELLANEOUS) ×2 IMPLANT
SPECIMEN JAR SMALL (MISCELLANEOUS) ×1 IMPLANT
SUT MON AB 4-0 PC3 18 (SUTURE) ×2 IMPLANT
SUT VICRYL 0 UR6 27IN ABS (SUTURE) ×1 IMPLANT
TOWEL OR 17X24 6PK STRL BLUE (TOWEL DISPOSABLE) ×2 IMPLANT
TRAY LAPAROSCOPIC (CUSTOM PROCEDURE TRAY) ×2 IMPLANT
TROCAR XCEL BLUNT TIP 100MML (ENDOMECHANICALS) ×2 IMPLANT
TROCAR XCEL NON-BLD 5MMX100MML (ENDOMECHANICALS) ×2 IMPLANT
TUBE CONNECTING 20X1/4 (TUBING) ×2 IMPLANT
TUBING INSUFFLATION (TUBING) ×2 IMPLANT

## 2015-06-03 NOTE — Op Note (Signed)
LAPAROSCOPIC CHOLECYSTECTOMY  Procedure Note  Yesenia Scott 06/03/2015   Pre-op Diagnosis: Cholelithiasis, cholecystitis     Post-op Diagnosis: acute cholecystitis with cholelithiasis  Procedure(s): LAPAROSCOPIC CHOLECYSTECTOMY  Surgeon(s): Coralie Keens, MD  Anesthesia: General  Staff:  Circulator: Maurene Capes, RN Scrub Person: Izora Ribas, RN; Lynelle Doctor, RN  Estimated Blood Loss: Minimal               Specimens: sent to path          [facility]   Date: 06/03/2015  Time: 10:50 AM

## 2015-06-03 NOTE — Anesthesia Procedure Notes (Signed)
Procedure Name: Intubation Date/Time: 06/03/2015 10:06 AM Performed by: Lyndee Leo Pre-anesthesia Checklist: Patient identified, Emergency Drugs available, Suction available and Patient being monitored Patient Re-evaluated:Patient Re-evaluated prior to inductionOxygen Delivery Method: Circle System Utilized Preoxygenation: Pre-oxygenation with 100% oxygen Intubation Type: IV induction, Cricoid Pressure applied and Rapid sequence Ventilation: Mask ventilation without difficulty Laryngoscope Size: Mac and 3 Grade View: Grade I Tube type: Oral Tube size: 7.0 mm Number of attempts: 1 Airway Equipment and Method: Stylet and Oral airway Placement Confirmation: ETT inserted through vocal cords under direct vision,  positive ETCO2 and breath sounds checked- equal and bilateral Tube secured with: Tape Dental Injury: Teeth and Oropharynx as per pre-operative assessment

## 2015-06-03 NOTE — Discharge Instructions (Signed)
[facility] ______[facility] LAPAROSCOPIC SURGERY: POST OP INSTRUCTIONS Always review your discharge instruction sheet given to you by the facility where your surgery was performed. IF YOU HAVE DISABILITY OR FAMILY LEAVE FORMS, YOU MUST BRING THEM TO THE OFFICE FOR PROCESSING.   DO NOT GIVE THEM TO YOUR DOCTOR.  1. A prescription for pain medication may be given to you upon discharge.  Take your pain medication as prescribed, if needed.  If narcotic pain medicine is not needed, then you may take acetaminophen (Tylenol) or ibuprofen (Advil) as needed. 2. Take your usually prescribed medications unless otherwise directed. 3. If you need a refill on your pain medication, please contact your pharmacy.  They will contact our office to request authorization. Prescriptions will not be filled after 5pm or on week-ends. 4. You should follow a light diet the first few days after arrival home, such as soup and crackers, etc.  Be sure to include lots of fluids daily. 5. Most patients will experience some swelling and bruising in the area of the incisions.  Ice packs will help.  Swelling and bruising can take several days to resolve.  6. It is common to experience some constipation if taking pain medication after surgery.  Increasing fluid intake and taking a stool softener (such as Colace) will usually help or prevent this problem from occurring.  A mild laxative (Milk of Magnesia or Miralax) should be taken according to package instructions if there are no bowel movements after 48 hours. 7. Unless discharge instructions indicate otherwise, you may remove your bandages 24-48 hours after surgery, and you may shower at that time.  You may have steri-strips (small skin tapes) in place directly over the incision.  These strips should be left on the skin for 7-10 days.  If your surgeon used skin glue on the incision, you may shower in 24 hours.  The glue will flake off over the next 2-3 weeks.  Any sutures or  staples will be removed at the office during your follow-up visit. 8. ACTIVITIES:  You may resume regular (light) daily activities beginning the next day--such as daily self-care, walking, climbing stairs--gradually increasing activities as tolerated.  You may have sexual intercourse when it is comfortable.  Refrain from any heavy lifting or straining until approved by your doctor. a. You may drive when you are no longer taking prescription pain medication, you can comfortably wear a seatbelt, and you can safely maneuver your car and apply brakes. b. RETURN TO WORK:  __CAN RETURN TO WORK ON February 20TH, 2017 WITH NO LIMITS________________________________________________________ 9. You should see your doctor in the office for a follow-up appointment approximately 2-3 weeks after your surgery.  Make sure that you call for this appointment within a day or two after you arrive home to insure a convenient appointment time. 10. OTHER INSTRUCTIONS: _NO LIFTING MORE THAN 15 POUNDS UNTIL 06/22/2015 11. ICE PACK AND IBUPROFEN ALSO FOR PAIN. 12. _________________________________________________________________________________________________________________________ __________________________________________________________________________________________________________________________ WHEN TO CALL YOUR DOCTOR: 1. Fever over 101.0 2. Inability to urinate 3. Continued bleeding from incision. 4. Increased pain, redness, or drainage from the incision. 5. Increasing abdominal pain  The clinic staff is available to answer your questions during regular business hours.  Please dont hesitate to call and ask to speak to one of the nurses for clinical concerns.  If you have a medical emergency, go to the nearest emergency room or call 911.  A surgeon from [facility] is always on call at the hospital. [address]  [address], [place], [place], [place]  [address] ? [address], [place], [place]   [address] [phone] ? [phone] ? FAX [phone] Web site: [email or web address]   Post Anesthesia Home Care Instructions  Activity: Get plenty of rest for the remainder of the day. A responsible adult should stay with you for 24 hours following the procedure.  For the next 24 hours, DO NOT: -Drive a car -Paediatric nurse -Drink alcoholic beverages -Take any medication unless instructed by your physician -Make any legal decisions or sign important papers.  Meals: Start with liquid foods such as gelatin or soup. Progress to regular foods as tolerated. Avoid greasy, spicy, heavy foods. If nausea and/or vomiting occur, drink only clear liquids until the nausea and/or vomiting subsides. Call your physician if vomiting continues.  Special Instructions/Symptoms: Your throat may feel dry or sore from the anesthesia or the breathing tube placed in your throat during surgery. If this causes discomfort, gargle with warm salt water. The discomfort should disappear within 24 hours.  If you had a scopolamine patch placed behind your ear for the management of post- operative nausea and/or vomiting:  1. The medication in the patch is effective for 72 hours, after which it should be removed.  Wrap patch in a tissue and discard in the trash. Wash hands thoroughly with soap and water. 2. You may remove the patch earlier than 72 hours if you experience unpleasant side effects which may include dry mouth, dizziness or visual disturbances. 3. Avoid touching the patch. Wash your hands with soap and water after contact with the patch.

## 2015-06-03 NOTE — Op Note (Signed)
NAMEVelva Scott NO.:  [id number]  MEDICAL RECORD NO.:  [id number]  LOCATION:                                 FACILITY:  PHYSICIAN:  Coralie Keens, M.D. DATE OF BIRTH:  05-Nov-1990  DATE OF PROCEDURE:  06/03/2015 DATE OF DISCHARGE:                              OPERATIVE REPORT   PREOPERATIVE DIAGNOSIS:  Symptomatic cholelithiasis.  POSTOPERATIVE DIAGNOSES:  Acute cholecystitis with cholelithiasis.  PROCEDURE:  Laparoscopic cholecystectomy.  SURGEON:  Coralie Keens, M.D.  ANESTHESIA:  General and 0.5% Marcaine.  ESTIMATED BLOOD LOSS:  Minimal.  INDICATIONS:  This is a 25 year old female, who presents with right upper quadrant epigastric abdominal pain, nausea, and vomiting.  She has had an ultrasound showing cholelithiasis with a thickened gallbladder wall.  Her liver function tests were normal as well as her white blood count.  Decision was made to proceed to the operating room for cholecystectomy.  FINDINGS:  The patient was found to have an acutely inflamed gallbladder with a gallstone impacted in the gallbladder neck and findings consistent with acute cholecystitis.  PROCEDURE IN DETAIL:  The patient was brought to the operating room, identified as [name].  She was placed supine on the operating room table where general anesthesia was induced.  Her abdomen was then prepped and draped in usual sterile fashion.  I made a small vertical incision below the umbilicus.  I carried this down to the fascia which was opened with a scalpel.  A hemostat was used to pass into the peritoneal cavity under direct vision.  A 0 Vicryl pursestring suture was then placed around the fascial opening.  The Hasson port was placed through the opening and insufflation of the abdomen was begun.  A 5-mm port was then placed in the patient's epigastrium and two more in the right upper quadrant all under direct vision.  The gallbladder was found to be acutely  inflamed and distended.  There was a gallstone impacted in the gallbladder neck.  I was able to grasp the gallbladder and retract above the liver bed.  The cystic duct was then dissected out, and a critical window was achieved around it.  It was clipped three times proximally, once distally, and transected.  The cystic artery and a posterior branch was identified, clipped proximally, distally, and transected as well.  The gallbladder was then slowly dissected free from the liver bed with electrocautery.  There was a lot of edema in the gallbladder wall.  Once this was freed from the gallbladder fossa, it was placed in an Endosac and removed through the incision at the umbilicus.  The 0 Vicryl in the umbilicus was tied in place closing the fascial defect.  I then irrigated the abdomen thoroughly with normal saline.  Hemostasis appeared to be achieved.  All ports were removed under direct vision and the abdomen was deflated.  I placed another figure-of-eight 0 Vicryl suture at the fascial opening at the umbilicus. All incisions were anesthetized with Marcaine and closed with 4-0 Monocryl subcuticular sutures.  Skin glue was then applied.  The patient tolerated the procedure well.  All the counts were correct at the end of the procedure.  The patient  was then extubated in the operating room and taken in stable condition to the recovery room.     Coralie Keens, M.D.     DB/MEDQ  D:  06/03/2015  T:  06/03/2015  Job:  [id number]

## 2015-06-03 NOTE — Interval H&P Note (Signed)
History and Physical Interval Note: no change in H and P  06/03/2015 9:44 AM  Yesenia Scott  has presented today for surgery, with the diagnosis of Cholelithiasis, cholecystitis  The various methods of treatment have been discussed with the patient and family. After consideration of risks, benefits and other options for treatment, the patient has consented to  Procedure(s): LAPAROSCOPIC CHOLECYSTECTOMY (N/A) as a surgical intervention .  The patient's history has been reviewed, patient examined, no change in status, stable for surgery.  I have reviewed the patient's chart and labs.  Questions were answered to the patient's satisfaction.     Leeasia Secrist A

## 2015-06-03 NOTE — Anesthesia Postprocedure Evaluation (Signed)
Anesthesia Post Note  Patient: Yesenia Scott  Procedure(s) Performed: Procedure(s) (LRB): LAPAROSCOPIC CHOLECYSTECTOMY (N/A)  Patient location during evaluation: PACU Anesthesia Type: General Level of consciousness: awake and alert Pain management: pain level controlled Vital Signs Assessment: post-procedure vital signs reviewed and stable Respiratory status: spontaneous breathing, nonlabored ventilation and respiratory function stable Cardiovascular status: blood pressure returned to baseline and stable Postop Assessment: no signs of nausea or vomiting Anesthetic complications: no    Last Vitals:  Filed Vitals:   06/03/15 1200 06/03/15 1248  BP: 120/67 109/77  Pulse: 78 62  Temp:  36.8 C  Resp: 20 18    Last Pain:  Filed Vitals:   06/03/15 1249  PainSc: 3                  Alyah Boehning A

## 2015-06-03 NOTE — Transfer of Care (Signed)
Immediate Anesthesia Transfer of Care Note  Patient: Yesenia Scott  Procedure(s) Performed: Procedure(s): LAPAROSCOPIC CHOLECYSTECTOMY (N/A)  Patient Location: PACU  Anesthesia Type:General  Level of Consciousness: awake, sedated and patient cooperative  Airway & Oxygen Therapy: Patient Spontanous Breathing and Patient connected to face mask oxygen  Post-op Assessment: Report given to RN and Post -op Vital signs reviewed and stable  Post vital signs: Reviewed and stable  Last Vitals:  Filed Vitals:   06/03/15 0816  BP: 131/83  Pulse: 60  Temp: 37 C  Resp: 18    Complications: No apparent anesthesia complications

## 2015-06-03 NOTE — Anesthesia Preprocedure Evaluation (Signed)
Anesthesia Evaluation  Patient identified by MRN, date of birth, ID band Patient awake    Reviewed: Allergy & Precautions, NPO status , Patient's Chart, lab work & pertinent test results  Airway Mallampati: I  TM Distance: >3 FB Neck ROM: Full    Dental  (+) Teeth Intact, Dental Advisory Given   Pulmonary former smoker,    breath sounds clear to auscultation       Cardiovascular  Rhythm:Regular Rate:Normal     Neuro/Psych    GI/Hepatic   Endo/Other    Renal/GU      Musculoskeletal   Abdominal   Peds  Hematology   Anesthesia Other Findings   Reproductive/Obstetrics                             Anesthesia Physical Anesthesia Plan  ASA: I  Anesthesia Plan: General   Post-op Pain Management:    Induction: Intravenous, Rapid sequence and Cricoid pressure planned  Airway Management Planned: Oral ETT  Additional Equipment:   Intra-op Plan:   Post-operative Plan: Extubation in OR  Informed Consent: I have reviewed the patients History and Physical, chart, labs and discussed the procedure including the risks, benefits and alternatives for the proposed anesthesia with the patient or authorized representative who has indicated his/her understanding and acceptance.   Dental advisory given  Plan Discussed with: CRNA, Anesthesiologist and Surgeon  Anesthesia Plan Comments:         Anesthesia Quick Evaluation

## 2015-06-04 ENCOUNTER — Encounter (HOSPITAL_BASED_OUTPATIENT_CLINIC_OR_DEPARTMENT_OTHER): Payer: Self-pay | Admitting: Surgery

## 2016-02-08 ENCOUNTER — Other Ambulatory Visit: Payer: Self-pay | Admitting: Surgery

## 2016-03-15 ENCOUNTER — Other Ambulatory Visit: Payer: Self-pay | Admitting: Gastroenterology

## 2016-03-15 DIAGNOSIS — R109 Unspecified abdominal pain: Secondary | ICD-10-CM

## 2016-03-16 ENCOUNTER — Ambulatory Visit
Admission: RE | Admit: 2016-03-16 | Discharge: 2016-03-16 | Disposition: A | Discharge status: Home / Self Care | Payer: 59 | Source: Ambulatory Visit | Attending: Gastroenterology | Admitting: Gastroenterology

## 2016-03-16 DIAGNOSIS — R109 Unspecified abdominal pain: Secondary | ICD-10-CM

## 2016-03-16 MED ORDER — IOPAMIDOL (ISOVUE-300) INJECTION 61%
100.0000 mL | Freq: Once | INTRAVENOUS | Status: AC | PRN
  Administered 2016-03-16: 100 mL via INTRAVENOUS

## 2016-03-31 ENCOUNTER — Encounter: Payer: 59 | Admitting: Women's Health

## 2016-04-14 ENCOUNTER — Ambulatory Visit (INDEPENDENT_AMBULATORY_CARE_PROVIDER_SITE_OTHER): Payer: 59 | Admitting: Women's Health

## 2016-04-14 ENCOUNTER — Encounter: Payer: Self-pay | Admitting: Women's Health

## 2016-04-14 VITALS — BP 126/80 | Ht 65.0 in | Wt 205.0 lb

## 2016-04-14 DIAGNOSIS — Z01419 Encounter for gynecological examination (general) (routine) without abnormal findings: Secondary | ICD-10-CM | POA: Diagnosis not present

## 2016-04-14 DIAGNOSIS — Z3041 Encounter for surveillance of contraceptive pills: Secondary | ICD-10-CM

## 2016-04-14 MED ORDER — NORGESTIMATE-ETH ESTRADIOL 0.25-35 MG-MCG PO TABS: 1.0000 | ORAL_TABLET | Freq: Every day | ORAL | 4 refills | Status: DC

## 2016-04-14 NOTE — Patient Instructions (Signed)

## 2016-04-14 NOTE — Progress Notes (Signed)
Yesenia Scott May 13, 1990 [id number]    History:    Presents for annual exam with no complaints. Regular monthly cylcles on Sprintec with good relief of dysmenorrhea. Lesbian/same partner for 3 years.  Would like to have no period.  2015 Normal Pap. 06/2015 Laparoscopic Cholecystectomy. Gardasil completed. Continues to have upper abdominal pain/indigestion and is seeing GI doctor .  Past medical history, past surgical history, family history and social history were all reviewed and documented in the EPIC chart. Student at [employer], only 2 classes remaining.  Considering [employer]. Working nights. Mother diabetes and hypertension.   ROS:  A ROS was performed and pertinent positives and negatives are included.  Exam:  Vitals:   04/14/16 1123  BP: 126/80  Weight: 205 lb (93 kg)  Height: 5\' 5"  (1.651 m)   Body mass index is 34.11 kg/m.   General appearance:  Normal Thyroid:  Symmetrical, normal in size, without palpable masses or nodularity. Respiratory  Auscultation:  Clear without wheezing or rhonchi Cardiovascular  Auscultation:  Regular rate, without rubs, murmurs or gallops  Edema/varicosities:  Not grossly evident Abdominal  Soft,nontender, without masses, guarding or rebound.  Liver/spleen:  No organomegaly noted  Hernia:  None appreciated  Skin  Inspection:  Grossly normal   Breasts: Examined lying and sitting.     Right: Without masses, retractions, discharge or axillary adenopathy.     Left: Without masses, retractions, discharge or axillary adenopathy. Gentitourinary   Inguinal/mons:  Normal without inguinal adenopathy  External genitalia:  Normal  BUS/Urethra/Skene's glands:  Normal  Vagina:  Normal  Cervix:  Normal  Uterus:  Normal in size, shape and contour.  Midline and mobile  Adnexa/parametria:     Rt: Without masses or tenderness.   Lt: Without masses or tenderness.  Anus and perineum: Normal   Assessment/Plan:  25 y.o. S [place] lesbian for annual exam with  no compliants.  Regular monthly cycle, on Sprintec Obesity  Plan: Sprintec 1 tablet daily, instructions given for continuous use , reviewed, stop for 4 days with spotting..  Slight risk for blood clots and stroke reviewed.  Reviewed importance of heart healthy diet and daily exercise.  Options for future pregnancy/donor insemination. SBE's, exercise, calcium rich diet encouraged. Labs at primary care. Pap normal 2015, new screening guidelines reviewed.  Huel Cote [facility], 12:10 PM 04/14/2016

## 2016-05-29 ENCOUNTER — Other Ambulatory Visit: Payer: Self-pay | Admitting: Women's Health

## 2016-05-29 DIAGNOSIS — Z3041 Encounter for surveillance of contraceptive pills: Secondary | ICD-10-CM

## 2016-09-14 ENCOUNTER — Encounter: Payer: Self-pay | Admitting: Gynecology

## 2016-12-01 DIAGNOSIS — I8002 Phlebitis and thrombophlebitis of superficial vessels of left lower extremity: Secondary | ICD-10-CM | POA: Diagnosis not present

## 2017-04-17 ENCOUNTER — Ambulatory Visit (INDEPENDENT_AMBULATORY_CARE_PROVIDER_SITE_OTHER): Payer: 59 | Admitting: Women's Health

## 2017-04-17 ENCOUNTER — Encounter: Payer: Self-pay | Admitting: Women's Health

## 2017-04-17 VITALS — BP 124/80 | Ht 65.0 in | Wt 211.0 lb

## 2017-04-17 DIAGNOSIS — Z01419 Encounter for gynecological examination (general) (routine) without abnormal findings: Secondary | ICD-10-CM | POA: Diagnosis not present

## 2017-04-17 DIAGNOSIS — Z3041 Encounter for surveillance of contraceptive pills: Secondary | ICD-10-CM | POA: Diagnosis not present

## 2017-04-17 LAB — CBC WITH DIFFERENTIAL/PLATELET
BASOS ABS: 49 {cells}/uL (ref 0–200)
Basophils Relative: 0.5 %
EOS PCT: 0.6 %
Eosinophils Absolute: 59 cells/uL (ref 15–500)
HEMATOCRIT: 38.2 % (ref 35.0–45.0)
Hemoglobin: 13 g/dL (ref 11.7–15.5)
Lymphs Abs: 3058 cells/uL (ref 850–3900)
MCH: 29.5 pg (ref 27.0–33.0)
MCHC: 34 g/dL (ref 32.0–36.0)
MCV: 86.6 fL (ref 80.0–100.0)
MONOS PCT: 5.9 %
MPV: 9.5 fL (ref 7.5–12.5)
NEUTROS PCT: 61.8 %
Neutro Abs: 6056 cells/uL (ref 1500–7800)
PLATELETS: 337 10*3/uL (ref 140–400)
RBC: 4.41 10*6/uL (ref 3.80–5.10)
RDW: 11.8 % (ref 11.0–15.0)
TOTAL LYMPHOCYTE: 31.2 %
WBC mixed population: 578 cells/uL (ref 200–950)
WBC: 9.8 10*3/uL (ref 3.8–10.8)

## 2017-04-17 LAB — GLUCOSE, RANDOM: Glucose, Bld: 88 mg/dL (ref 65–99)

## 2017-04-17 MED ORDER — NORGESTIMATE-ETH ESTRADIOL 0.25-35 MG-MCG PO TABS
1.0000 | ORAL_TABLET | Freq: Every day | ORAL | 4 refills | Status: DC
Start: 2017-04-17 — End: 2017-07-04

## 2017-04-17 NOTE — Addendum Note (Signed)
Addended by: Lorine Bears on: 04/17/2017 01:54 PM   Modules accepted: Orders

## 2017-04-17 NOTE — Progress Notes (Signed)
Yesenia Scott November 15, 1990 [id number]    History:    Presents for annual exam.  Amenorrheic on Sprintec continuously. Lesbian relationship greater than 4 years. Gardasil series completed. Pap history normal.  Past medical history, past surgical history, family history and social history were all reviewed and documented in the EPIC chart. Works at [employer] History of a fractured L5 from sports. Mother diabetes and hypertension. Graduated from [employer], planning to return to [place].  ROS:  A ROS was performed and pertinent positives and negatives are included.  Exam:  Vitals:   04/17/17 1128  BP: 124/80  Weight: 211 lb (95.7 kg)  Height: 5\' 5"  (1.651 m)   Body mass index is 35.11 kg/m.   General appearance:  Normal Thyroid:  Symmetrical, normal in size, without palpable masses or nodularity. Respiratory  Auscultation:  Clear without wheezing or rhonchi Cardiovascular  Auscultation:  Regular rate, without rubs, murmurs or gallops  Edema/varicosities:  Not grossly evident Abdominal  Soft,nontender, without masses, guarding or rebound.  Liver/spleen:  No organomegaly noted  Hernia:  None appreciated  Skin  Inspection:  Grossly normal   Breasts: Examined lying and sitting.     Right: Without masses, retractions, discharge or axillary adenopathy.     Left: Without masses, retractions, discharge or axillary adenopathy. Gentitourinary   Inguinal/mons:  Normal without inguinal adenopathy  External genitalia:  Normal  BUS/Urethra/Skene's glands:  Normal  Vagina:  Normal  Cervix:  Normal  Uterus:  normal in size, shape and contour.  Midline and mobile  Adnexa/parametria:     Rt: Without masses or tenderness.   Lt: Without masses or tenderness.  Anus and perineum: Normal  Assessment/Plan:  25 y.o. S WF/lesbian G0 for annual exam with no complaints.  Sprintec continuously with good relief of dysmenorrhea Obesity  Plan: Sprintec prescription, proper use, slight risk for  blood clots and strokes reviewed. SBE's, exercise, calcium rich diet, MVI daily encouraged. Encouraged low carb/calorie diet and increased exercise for weight loss. CBC, glucose, Pap. New screening guidelines reviewed.[facility], 12:11 PM 04/17/2017

## 2017-04-17 NOTE — Patient Instructions (Signed)
Health Maintenance, Female Adopting a healthy lifestyle and getting preventive care can go a long way to promote health and wellness. Talk with your health care provider about what schedule of regular examinations is right for you. This is a good chance for you to check in with your provider about disease prevention and staying healthy. In between checkups, there are plenty of things you can do on your own. Experts have done a lot of research about which lifestyle changes and preventive measures are most likely to keep you healthy. Ask your health care provider for more information. Weight and diet Eat a healthy diet  Be sure to include plenty of vegetables, fruits, low-fat dairy products, and lean protein.  Do not eat a lot of foods high in solid fats, added sugars, or salt.  Get regular exercise. This is one of the most important things you can do for your health. ? Most adults should exercise for at least 150 minutes each week. The exercise should increase your heart rate and make you sweat (moderate-intensity exercise). ? Most adults should also do strengthening exercises at least twice a week. This is in addition to the moderate-intensity exercise.  Maintain a healthy weight  Body mass index (BMI) is a measurement that can be used to identify possible weight problems. It estimates body fat based on height and weight. Your health care provider can help determine your BMI and help you achieve or maintain a healthy weight.  For females 20 years of age and older: ? A BMI below 18.5 is considered underweight. ? A BMI of 18.5 to 24.9 is normal. ? A BMI of 25 to 29.9 is considered overweight. ? A BMI of 30 and above is considered obese.  Watch levels of cholesterol and blood lipids  You should start having your blood tested for lipids and cholesterol at 25 years of age, then have this test every 5 years.  You may need to have your cholesterol levels checked more often if: ? Your lipid or  cholesterol levels are high. ? You are older than 25 years of age. ? You are at high risk for heart disease.  Cancer screening Lung Cancer  Lung cancer screening is recommended for adults 55-80 years old who are at high risk for lung cancer because of a history of smoking.  A yearly low-dose CT scan of the lungs is recommended for people who: ? Currently smoke. ? Have quit within the past 15 years. ? Have at least a 30-pack-year history of smoking. A pack year is smoking an average of one pack of cigarettes a day for 1 year.  Yearly screening should continue until it has been 15 years since you quit.  Yearly screening should stop if you develop a health problem that would prevent you from having lung cancer treatment.  Breast Cancer  Practice breast self-awareness. This means understanding how your breasts normally appear and feel.  It also means doing regular breast self-exams. Let your health care provider know about any changes, no matter how small.  If you are in your 20s or 30s, you should have a clinical breast exam (CBE) by a health care provider every 1-3 years as part of a regular health exam.  If you are 40 or older, have a CBE every year. Also consider having a breast X-ray (mammogram) every year.  If you have a family history of breast cancer, talk to your health care provider about genetic screening.  If you are at high risk   for breast cancer, talk to your health care provider about having an MRI and a mammogram every year.  Breast cancer gene (BRCA) assessment is recommended for women who have family members with BRCA-related cancers. BRCA-related cancers include: ? Breast. ? Ovarian. ? Tubal. ? Peritoneal cancers.  Results of the assessment will determine the need for genetic counseling and BRCA1 and BRCA2 testing.  Cervical Cancer Your health care provider may recommend that you be screened regularly for cancer of the pelvic organs (ovaries, uterus, and  vagina). This screening involves a pelvic examination, including checking for microscopic changes to the surface of your cervix (Pap test). You may be encouraged to have this screening done every 3 years, beginning at age 22.  For women ages 56-65, health care providers may recommend pelvic exams and Pap testing every 3 years, or they may recommend the Pap and pelvic exam, combined with testing for human papilloma virus (HPV), every 5 years. Some types of HPV increase your risk of cervical cancer. Testing for HPV may also be done on women of any age with unclear Pap test results.  Other health care providers may not recommend any screening for nonpregnant women who are considered low risk for pelvic cancer and who do not have symptoms. Ask your health care provider if a screening pelvic exam is right for you.  If you have had past treatment for cervical cancer or a condition that could lead to cancer, you need Pap tests and screening for cancer for at least 20 years after your treatment. If Pap tests have been discontinued, your risk factors (such as having a new sexual partner) need to be reassessed to determine if screening should resume. Some women have medical problems that increase the chance of getting cervical cancer. In these cases, your health care provider may recommend more frequent screening and Pap tests.  Colorectal Cancer  This type of cancer can be detected and often prevented.  Routine colorectal cancer screening usually begins at 25 years of age and continues through 25 years of age.  Your health care provider may recommend screening at an earlier age if you have risk factors for colon cancer.  Your health care provider may also recommend using home test kits to check for hidden blood in the stool.  A small camera at the end of a tube can be used to examine your colon directly (sigmoidoscopy or colonoscopy). This is done to check for the earliest forms of colorectal  cancer.  Routine screening usually begins at age 33.  Direct examination of the colon should be repeated every 5-10 years through 25 years of age. However, you may need to be screened more often if early forms of precancerous polyps or small growths are found.  Skin Cancer  Check your skin from head to toe regularly.  Tell your health care provider about any new moles or changes in moles, especially if there is a change in a mole's shape or color.  Also tell your health care provider if you have a mole that is larger than the size of a pencil eraser.  Always use sunscreen. Apply sunscreen liberally and repeatedly throughout the day.  Protect yourself by wearing long sleeves, pants, a wide-brimmed hat, and sunglasses whenever you are outside.  Heart disease, diabetes, and high blood pressure  High blood pressure causes heart disease and increases the risk of stroke. High blood pressure is more likely to develop in: ? People who have blood pressure in the high end of  the normal range (130-139/85-89 mm Hg). ? People who are overweight or obese. ? People who are African American.  If you are 21-29 years of age, have your blood pressure checked every 3-5 years. If you are 3 years of age or older, have your blood pressure checked every year. You should have your blood pressure measured twice-once when you are at a hospital or clinic, and once when you are not at a hospital or clinic. Record the average of the two measurements. To check your blood pressure when you are not at a hospital or clinic, you can use: ? An automated blood pressure machine at a pharmacy. ? A home blood pressure monitor.  If you are between 17 years and 37 years old, ask your health care provider if you should take aspirin to prevent strokes.  Have regular diabetes screenings. This involves taking a blood sample to check your fasting blood sugar level. ? If you are at a normal weight and have a low risk for diabetes,  have this test once every three years after 25 years of age. ? If you are overweight and have a high risk for diabetes, consider being tested at a younger age or more often. Preventing infection Hepatitis B  If you have a higher risk for hepatitis B, you should be screened for this virus. You are considered at high risk for hepatitis B if: ? You were born in a country where hepatitis B is common. Ask your health care provider which countries are considered high risk. ? Your parents were born in a high-risk country, and you have not been immunized against hepatitis B (hepatitis B vaccine). ? You have HIV or AIDS. ? You use needles to inject street drugs. ? You live with someone who has hepatitis B. ? You have had sex with someone who has hepatitis B. ? You get hemodialysis treatment. ? You take certain medicines for conditions, including cancer, organ transplantation, and autoimmune conditions.  Hepatitis C  Blood testing is recommended for: ? Everyone born from [birth year] through 1964. ? Anyone with known risk factors for hepatitis C.  Sexually transmitted infections (STIs)  You should be screened for sexually transmitted infections (STIs) including gonorrhea and chlamydia if: ? You are sexually active and are younger than 25 years of age. ? You are older than 25 years of age and your health care provider tells you that you are at risk for this type of infection. ? Your sexual activity has changed since you were last screened and you are at an increased risk for chlamydia or gonorrhea. Ask your health care provider if you are at risk.  If you do not have HIV, but are at risk, it may be recommended that you take a prescription medicine daily to prevent HIV infection. This is called pre-exposure prophylaxis (PrEP). You are considered at risk if: ? You are sexually active and do not regularly use condoms or know the HIV status of your partner(s). ? You take drugs by injection. ? You are  sexually active with a partner who has HIV.  Talk with your health care provider about whether you are at high risk of being infected with HIV. If you choose to begin PrEP, you should first be tested for HIV. You should then be tested every 3 months for as long as you are taking PrEP. Pregnancy  If you are premenopausal and you may become pregnant, ask your health care provider about preconception counseling.  If you may become  pregnant, take 400 to 800 micrograms (mcg) of folic acid every day.  If you want to prevent pregnancy, talk to your health care provider about birth control (contraception). Osteoporosis and menopause  Osteoporosis is a disease in which the bones lose minerals and strength with aging. This can result in serious bone fractures. Your risk for osteoporosis can be identified using a bone density scan.  If you are 65 years of age or older, or if you are at risk for osteoporosis and fractures, ask your health care provider if you should be screened.  Ask your health care provider whether you should take a calcium or vitamin D supplement to lower your risk for osteoporosis.  Menopause may have certain physical symptoms and risks.  Hormone replacement therapy may reduce some of these symptoms and risks. Talk to your health care provider about whether hormone replacement therapy is right for you. Follow these instructions at home:  Schedule regular health, dental, and eye exams.  Stay current with your immunizations.  Do not use any tobacco products including cigarettes, chewing tobacco, or electronic cigarettes.  If you are pregnant, do not drink alcohol.  If you are breastfeeding, limit how much and how often you drink alcohol.  Limit alcohol intake to no more than 1 drink per day for nonpregnant women. One drink equals 12 ounces of beer, 5 ounces of wine, or 1 ounces of hard liquor.  Do not use street drugs.  Do not share needles.  Ask your health care  provider for help if you need support or information about quitting drugs.  Tell your health care provider if you often feel depressed.  Tell your health care provider if you have ever been abused or do not feel safe at home. This information is not intended to replace advice given to you by your health care provider. Make sure you discuss any questions you have with your health care provider. Document Released: 11/01/2010 Document Revised: 09/24/2015 Document Reviewed: 01/20/2015 Elsevier Interactive Patient Education  2018 [employer]. Carbohydrate Counting for Diabetes Mellitus, Adult Carbohydrate counting is a method for keeping track of how many carbohydrates you eat. Eating carbohydrates naturally increases the amount of sugar (glucose) in the blood. Counting how many carbohydrates you eat helps keep your blood glucose within normal limits, which helps you manage your diabetes (diabetes mellitus). It is important to know how many carbohydrates you can safely have in each meal. This is different for every person. A diet and nutrition specialist (registered dietitian) can help you make a meal plan and calculate how many carbohydrates you should have at each meal and snack. Carbohydrates are found in the following foods:  Grains, such as breads and cereals.  Dried beans and soy products.  Starchy vegetables, such as potatoes, peas, and corn.  Fruit and fruit juices.  Milk and yogurt.  Sweets and snack foods, such as cake, cookies, candy, chips, and soft drinks.  How do I count carbohydrates? There are two ways to count carbohydrates in food. You can use either of the methods or a combination of both. Reading "Nutrition Facts" on packaged food The "Nutrition Facts" list is included on the labels of almost all packaged foods and beverages in the [place] It includes:  The serving size.  Information about nutrients in each serving, including the grams (g) of carbohydrate per  serving.  To use the "Nutrition Facts":  Decide how many servings you will have.  Multiply the number of servings by the number of carbohydrates   per serving.  The resulting number is the total amount of carbohydrates that you will be having.  Learning standard serving sizes of other foods When you eat foods containing carbohydrates that are not packaged or do not include "Nutrition Facts" on the label, you need to measure the servings in order to count the amount of carbohydrates:  Measure the foods that you will eat with a food scale or measuring cup, if needed.  Decide how many standard-size servings you will eat.  Multiply the number of servings by 15. Most carbohydrate-rich foods have about 15 g of carbohydrates per serving. ? For example, if you eat 8 oz (170 g) of strawberries, you will have eaten 2 servings and 30 g of carbohydrates (2 servings x 15 g = 30 g).  For foods that have more than one food mixed, such as soups and casseroles, you must count the carbohydrates in each food that is included.  The following list contains standard serving sizes of common carbohydrate-rich foods. Each of these servings has about 15 g of carbohydrates:   hamburger bun or  English muffin.   oz (15 mL) syrup.   oz (14 g) jelly.  1 slice of bread.  1 six-inch tortilla.  3 oz (85 g) cooked rice or pasta.  4 oz (113 g) cooked dried beans.  4 oz (113 g) starchy vegetable, such as peas, corn, or potatoes.  4 oz (113 g) hot cereal.  4 oz (113 g) mashed potatoes or  of a large baked potato.  4 oz (113 g) canned or frozen fruit.  4 oz (120 mL) fruit juice.  4-6 crackers.  6 chicken nuggets.  6 oz (170 g) unsweetened dry cereal.  6 oz (170 g) plain fat-free yogurt or yogurt sweetened with artificial sweeteners.  8 oz (240 mL) milk.  8 oz (170 g) fresh fruit or one small piece of fruit.  24 oz (680 g) popped popcorn.  Example of carbohydrate counting Sample meal  3  oz (85 g) chicken breast.  6 oz (170 g) brown rice.  4 oz (113 g) corn.  8 oz (240 mL) milk.  8 oz (170 g) strawberries with sugar-free whipped topping. Carbohydrate calculation 1. Identify the foods that contain carbohydrates: ? Rice. ? Corn. ? Milk. ? Strawberries. 2. Calculate how many servings you have of each food: ? 2 servings rice. ? 1 serving corn. ? 1 serving milk. ? 1 serving strawberries. 3. Multiply each number of servings by 15 g: ? 2 servings rice x 15 g = 30 g. ? 1 serving corn x 15 g = 15 g. ? 1 serving milk x 15 g = 15 g. ? 1 serving strawberries x 15 g = 15 g. 4. Add together all of the amounts to find the total grams of carbohydrates eaten: ? 30 g + 15 g + 15 g + 15 g = 75 g of carbohydrates total. This information is not intended to replace advice given to you by your health care provider. Make sure you discuss any questions you have with your health care provider. Document Released: 04/18/2005 Document Revised: 11/06/2015 Document Reviewed: 09/30/2015 Elsevier Interactive Patient Education  [employer].

## 2017-04-18 LAB — PAP IG W/ RFLX HPV ASCU

## 2017-07-04 ENCOUNTER — Other Ambulatory Visit: Payer: Self-pay

## 2017-07-04 MED ORDER — NORGESTIMATE-ETH ESTRADIOL 0.25-35 MG-MCG PO TABS: ORAL_TABLET | ORAL | 3 refills | Status: DC

## 2017-07-04 NOTE — Telephone Encounter (Signed)
Pharmacy sent a fax showing that ins co denied Sprintec and said OrthoCyclen preferred. They ask [place] to send Rx for OrthoCyclen. Rx sent.

## 2018-04-18 ENCOUNTER — Encounter: Payer: 59 | Admitting: Women's Health

## 2018-04-18 DIAGNOSIS — Z0289 Encounter for other administrative examinations: Secondary | ICD-10-CM

## 2018-06-12 ENCOUNTER — Encounter: Payer: 59 | Admitting: Women's Health

## 2018-07-18 ENCOUNTER — Other Ambulatory Visit: Payer: Self-pay

## 2018-07-18 ENCOUNTER — Ambulatory Visit (INDEPENDENT_AMBULATORY_CARE_PROVIDER_SITE_OTHER): Payer: 59 | Admitting: Women's Health

## 2018-07-18 ENCOUNTER — Encounter: Payer: Self-pay | Admitting: Women's Health

## 2018-07-18 VITALS — BP 124/82 | Ht 65.0 in | Wt 213.0 lb

## 2018-07-18 DIAGNOSIS — Z01419 Encounter for gynecological examination (general) (routine) without abnormal findings: Secondary | ICD-10-CM | POA: Diagnosis not present

## 2018-07-18 LAB — CBC WITH DIFFERENTIAL/PLATELET
Absolute Monocytes: 470 cells/uL (ref 200–950)
BASOS ABS: 32 {cells}/uL (ref 0–200)
Basophils Relative: 0.4 %
EOS ABS: 57 {cells}/uL (ref 15–500)
EOS PCT: 0.7 %
HEMATOCRIT: 40 % (ref 35.0–45.0)
HEMOGLOBIN: 13.7 g/dL (ref 11.7–15.5)
Lymphs Abs: 2219 cells/uL (ref 850–3900)
MCH: 30.1 pg (ref 27.0–33.0)
MCHC: 34.3 g/dL (ref 32.0–36.0)
MCV: 87.9 fL (ref 80.0–100.0)
MONOS PCT: 5.8 %
MPV: 9.5 fL (ref 7.5–12.5)
NEUTROS PCT: 65.7 %
Neutro Abs: 5322 cells/uL (ref 1500–7800)
Platelets: 365 10*3/uL (ref 140–400)
RBC: 4.55 10*6/uL (ref 3.80–5.10)
RDW: 11.9 % (ref 11.0–15.0)
Total Lymphocyte: 27.4 %
WBC: 8.1 10*3/uL (ref 3.8–10.8)

## 2018-07-18 LAB — GLUCOSE, RANDOM: GLUCOSE: 90 mg/dL (ref 65–99)

## 2018-07-18 MED ORDER — NORGESTIMATE-ETH ESTRADIOL 0.25-35 MG-MCG PO TABS: ORAL_TABLET | ORAL | 4 refills | Status: DC

## 2018-07-18 NOTE — Progress Notes (Signed)
Yesenia Scott [employer] [birth year] [id number]    History:    Presents for annual exam.  Amenorrheic on Sprintec continuously, has 2 short cycles per year.  Same-sex partner for over 5 years, denies need for STD screen.  Normal Pap history.  Gardasil series completed.  Past medical history, past surgical history, family history and social history were all reviewed and documented in the EPIC chart.  Works at [employer] taking  online courses at [employer] for business..  Mother diabetes and hypertension.  History of an L5 fracture from sports.  ROS:  A ROS was performed and pertinent positives and negatives are included.  Exam:  Vitals:   07/18/18 0812  BP: 124/82  Weight: 213 lb (96.6 kg)  Height: 5\' 5"  (1.651 m)   Body mass index is 35.45 kg/m.   General appearance:  Normal Thyroid:  Symmetrical, normal in size, without palpable masses or nodularity. Respiratory  Auscultation:  Clear without wheezing or rhonchi Cardiovascular  Auscultation:  Regular rate, without rubs, murmurs or gallops  Edema/varicosities:  Not grossly evident Abdominal  Soft,nontender, without masses, guarding or rebound.  Liver/spleen:  No organomegaly noted  Hernia:  None appreciated  Skin  Inspection:  Grossly normal   Breasts: Examined lying and sitting.     Right: Without masses, retractions, discharge or axillary adenopathy.     Left: Without masses, retractions, discharge or axillary adenopathy. Gentitourinary   Inguinal/mons:  Normal without inguinal adenopathy  External genitalia:  Normal  BUS/Urethra/Skene's glands:  Normal  Vagina:  Normal  Cervix:  Normal  Uterus:  normal in size, shape and contour.  Midline and mobile  Adnexa/parametria:     Rt: Without masses or tenderness.   Lt: Without masses or tenderness.  Anus and perineum: Normal   Assessment/Plan:  28 y.o. S WF G0/same-sex relationship for annual exam no complaints.  Sprintec continuously amenorrhea Obesity  Plan: Sprintec  prescription, proper use, slight risk for blood clots and strokes reviewed.  Stop for 4 days with spotting to cycle.  SBEs, exercise, calcium rich foods, MVI daily encouraged.  Reviewed importance of increasing cardio type exercise and decreasing calorie/carbs.  CBC, glucose, Pap normal 2018, new screening guidelines reviewed.[facility]uel [facility], 8:32 AM 07/18/2018

## 2018-07-18 NOTE — Patient Instructions (Addendum)
Health Maintenance, Female Adopting a healthy lifestyle and getting preventive care can go a long way to promote health and wellness. Talk with your health care provider about what schedule of regular examinations is right for you. This is a good chance for you to check in with your provider about disease prevention and staying healthy. In between checkups, there are plenty of things you can do on your own. Experts have done a lot of research about which lifestyle changes and preventive measures are most likely to keep you healthy. Ask your health care provider for more information. Weight and diet Eat a healthy diet  Be sure to include plenty of vegetables, fruits, low-fat dairy products, and lean protein.  Do not eat a lot of foods high in solid fats, added sugars, or salt.  Get regular exercise. This is one of the most important things you can do for your health. ? Most adults should exercise for at least 150 minutes each week. The exercise should increase your heart rate and make you sweat (moderate-intensity exercise). ? Most adults should also do strengthening exercises at least twice a week. This is in addition to the moderate-intensity exercise. Maintain a healthy weight  Body mass index (BMI) is a measurement that can be used to identify possible weight problems. It estimates body fat based on height and weight. Your health care provider can help determine your BMI and help you achieve or maintain a healthy weight.  For females 20 years of age and older: ? A BMI below 18.5 is considered underweight. ? A BMI of 18.5 to 24.9 is normal. ? A BMI of 25 to 29.9 is considered overweight. ? A BMI of 30 and above is considered obese. Watch levels of cholesterol and blood lipids  You should start having your blood tested for lipids and cholesterol at 27 years of age, then have this test every 5 years.  You may need to have your cholesterol levels checked more often if: ? Your lipid or  cholesterol levels are high. ? You are older than 27 years of age. ? You are at high risk for heart disease. Cancer screening Lung Cancer  Lung cancer screening is recommended for adults 55-80 years old who are at high risk for lung cancer because of a history of smoking.  A yearly low-dose CT scan of the lungs is recommended for people who: ? Currently smoke. ? Have quit within the past 15 years. ? Have at least a 30-pack-year history of smoking. A pack year is smoking an average of one pack of cigarettes a day for 1 year.  Yearly screening should continue until it has been 15 years since you quit.  Yearly screening should stop if you develop a health problem that would prevent you from having lung cancer treatment. Breast Cancer  Practice breast self-awareness. This means understanding how your breasts normally appear and feel.  It also means doing regular breast self-exams. Let your health care provider know about any changes, no matter how small.  If you are in your 20s or 30s, you should have a clinical breast exam (CBE) by a health care provider every 1-3 years as part of a regular health exam.  If you are 40 or older, have a CBE every year. Also consider having a breast X-ray (mammogram) every year.  If you have a family history of breast cancer, talk to your health care provider about genetic screening.  If you are at high risk for breast cancer, talk   to your health care provider about having an MRI and a mammogram every year.  Breast cancer gene (BRCA) assessment is recommended for women who have family members with BRCA-related cancers. BRCA-related cancers include: ? Breast. ? Ovarian. ? Tubal. ? Peritoneal cancers.  Results of the assessment will determine the need for genetic counseling and BRCA1 and BRCA2 testing. Cervical Cancer Your health care provider may recommend that you be screened regularly for cancer of the pelvic organs (ovaries, uterus, and vagina).  This screening involves a pelvic examination, including checking for microscopic changes to the surface of your cervix (Pap test). You may be encouraged to have this screening done every 3 years, beginning at age 21.  For women ages 30-65, health care providers may recommend pelvic exams and Pap testing every 3 years, or they may recommend the Pap and pelvic exam, combined with testing for human papilloma virus (HPV), every 5 years. Some types of HPV increase your risk of cervical cancer. Testing for HPV may also be done on women of any age with unclear Pap test results.  Other health care providers may not recommend any screening for nonpregnant women who are considered low risk for pelvic cancer and who do not have symptoms. Ask your health care provider if a screening pelvic exam is right for you.  If you have had past treatment for cervical cancer or a condition that could lead to cancer, you need Pap tests and screening for cancer for at least 20 years after your treatment. If Pap tests have been discontinued, your risk factors (such as having a new sexual partner) need to be reassessed to determine if screening should resume. Some women have medical problems that increase the chance of getting cervical cancer. In these cases, your health care provider may recommend more frequent screening and Pap tests. Colorectal Cancer  This type of cancer can be detected and often prevented.  Routine colorectal cancer screening usually begins at 27 years of age and continues through 27 years of age.  Your health care provider may recommend screening at an earlier age if you have risk factors for colon cancer.  Your health care provider may also recommend using home test kits to check for hidden blood in the stool.  A small camera at the end of a tube can be used to examine your colon directly (sigmoidoscopy or colonoscopy). This is done to check for the earliest forms of colorectal cancer.  Routine  screening usually begins at age 50.  Direct examination of the colon should be repeated every 5-10 years through 27 years of age. However, you may need to be screened more often if early forms of precancerous polyps or small growths are found. Skin Cancer  Check your skin from head to toe regularly.  Tell your health care provider about any new moles or changes in moles, especially if there is a change in a mole's shape or color.  Also tell your health care provider if you have a mole that is larger than the size of a pencil eraser.  Always use sunscreen. Apply sunscreen liberally and repeatedly throughout the day.  Protect yourself by wearing long sleeves, pants, a wide-brimmed hat, and sunglasses whenever you are outside. Heart disease, diabetes, and high blood pressure  High blood pressure causes heart disease and increases the risk of stroke. High blood pressure is more likely to develop in: ? People who have blood pressure in the high end of the normal range (130-139/85-89 mm Hg). ? People   who are overweight or obese. ? People who are African American.  If you are 84-22 years of age, have your blood pressure checked every 3-5 years. If you are 67 years of age or older, have your blood pressure checked every year. You should have your blood pressure measured twice-once when you are at a hospital or clinic, and once when you are not at a hospital or clinic. Record the average of the two measurements. To check your blood pressure when you are not at a hospital or clinic, you can use: ? An automated blood pressure machine at a pharmacy. ? A home blood pressure monitor.  If you are between 52 years and 3 years old, ask your health care provider if you should take aspirin to prevent strokes.  Have regular diabetes screenings. This involves taking a blood sample to check your fasting blood sugar level. ? If you are at a normal weight and have a low risk for diabetes, have this test once  every three years after 27 years of age. ? If you are overweight and have a high risk for diabetes, consider being tested at a younger age or more often. Preventing infection Hepatitis B  If you have a higher risk for hepatitis B, you should be screened for this virus. You are considered at high risk for hepatitis B if: ? You were born in a country where hepatitis B is common. Ask your health care provider which countries are considered high risk. ? Your parents were born in a high-risk country, and you have not been immunized against hepatitis B (hepatitis B vaccine). ? You have HIV or AIDS. ? You use needles to inject street drugs. ? You live with someone who has hepatitis B. ? You have had sex with someone who has hepatitis B. ? You get hemodialysis treatment. ? You take certain medicines for conditions, including cancer, organ transplantation, and autoimmune conditions. Hepatitis C  Blood testing is recommended for: ? Everyone born from [birth year] through 1964. ? Anyone with known risk factors for hepatitis C. Sexually transmitted infections (STIs)  You should be screened for sexually transmitted infections (STIs) including gonorrhea and chlamydia if: ? You are sexually active and are younger than 27 years of age. ? You are older than 27 years of age and your health care provider tells you that you are at risk for this type of infection. ? Your sexual activity has changed since you were last screened and you are at an increased risk for chlamydia or gonorrhea. Ask your health care provider if you are at risk.  If you do not have HIV, but are at risk, it may be recommended that you take a prescription medicine daily to prevent HIV infection. This is called pre-exposure prophylaxis (PrEP). You are considered at risk if: ? You are sexually active and do not regularly use condoms or know the HIV status of your partner(s). ? You take drugs by injection. ? You are sexually active with a partner  who has HIV. Talk with your health care provider about whether you are at high risk of being infected with HIV. If you choose to begin PrEP, you should first be tested for HIV. You should then be tested every 3 months for as long as you are taking PrEP. Pregnancy  If you are premenopausal and you may become pregnant, ask your health care provider about preconception counseling.  If you may become pregnant, take 400 to 800 micrograms (mcg) of folic acid every  day.  If you want to prevent pregnancy, talk to your health care provider about birth control (contraception). Osteoporosis and menopause  Osteoporosis is a disease in which the bones lose minerals and strength with aging. This can result in serious bone fractures. Your risk for osteoporosis can be identified using a bone density scan.  If you are 65 years of age or older, or if you are at risk for osteoporosis and fractures, ask your health care provider if you should be screened.  Ask your health care provider whether you should take a calcium or vitamin D supplement to lower your risk for osteoporosis.  Menopause may have certain physical symptoms and risks.  Hormone replacement therapy may reduce some of these symptoms and risks. Talk to your health care provider about whether hormone replacement therapy is right for you. Follow these instructions at home:  Schedule regular health, dental, and eye exams.  Stay current with your immunizations.  Do not use any tobacco products including cigarettes, chewing tobacco, or electronic cigarettes.  If you are pregnant, do not drink alcohol.  If you are breastfeeding, limit how much and how often you drink alcohol.  Limit alcohol intake to no more than 1 drink per day for nonpregnant women. One drink equals 12 ounces of beer, 5 ounces of wine, or 1 ounces of hard liquor.  Do not use street drugs.  Do not share needles.  Ask your health care provider for help if you need support  or information about quitting drugs.  Tell your health care provider if you often feel depressed.  Tell your health care provider if you have ever been abused or do not feel safe at home. This information is not intended to replace advice given to you by your health care provider. Make sure you discuss any questions you have with your health care provider. Document Released: 11/01/2010 Document Revised: 09/24/2015 Document Reviewed: 01/20/2015 Elsevier Interactive Patient Education  2019 [employer]  Carbohydrate Counting for Diabetes Mellitus, Adult  Carbohydrate counting is a method of keeping track of how many carbohydrates you eat. Eating carbohydrates naturally increases the amount of sugar (glucose) in the blood. Counting how many carbohydrates you eat helps keep your blood glucose within normal limits, which helps you manage your diabetes (diabetes mellitus). It is important to know how many carbohydrates you can safely have in each meal. This is different for every person. A diet and nutrition specialist (registered dietitian) can help you make a meal plan and calculate how many carbohydrates you should have at each meal and snack. Carbohydrates are found in the following foods:  Grains, such as breads and cereals.  Dried beans and soy products.  Starchy vegetables, such as potatoes, peas, and corn.  Fruit and fruit juices.  Milk and yogurt.  Sweets and snack foods, such as cake, cookies, candy, chips, and soft drinks. How do I count carbohydrates? There are two ways to count carbohydrates in food. You can use either of the methods or a combination of both. Reading "Nutrition Facts" on packaged food The "Nutrition Facts" list is included on the labels of almost all packaged foods and beverages in the [place] It includes:  The serving size.  Information about nutrients in each serving, including the grams (g) of carbohydrate per serving. To use the "Nutrition  Facts":  Decide how many servings you will have.  Multiply the number of servings by the number of carbohydrates per serving.  The resulting number is the total amount of   carbohydrates that you will be having. Learning standard serving sizes of other foods When you eat carbohydrate foods that are not packaged or do not include "Nutrition Facts" on the label, you need to measure the servings in order to count the amount of carbohydrates:  Measure the foods that you will eat with a food scale or measuring cup, if needed.  Decide how many standard-size servings you will eat.  Multiply the number of servings by 15. Most carbohydrate-rich foods have about 15 g of carbohydrates per serving. ? For example, if you eat 8 oz (170 g) of strawberries, you will have eaten 2 servings and 30 g of carbohydrates (2 servings x 15 g = 30 g).  For foods that have more than one food mixed, such as soups and casseroles, you must count the carbohydrates in each food that is included. The following list contains standard serving sizes of common carbohydrate-rich foods. Each of these servings has about 15 g of carbohydrates:   hamburger bun or  English muffin.   oz (15 mL) syrup.   oz (14 g) jelly.  1 slice of bread.  1 six-inch tortilla.  3 oz (85 g) cooked rice or pasta.  4 oz (113 g) cooked dried beans.  4 oz (113 g) starchy vegetable, such as peas, corn, or potatoes.  4 oz (113 g) hot cereal.  4 oz (113 g) mashed potatoes or  of a large baked potato.  4 oz (113 g) canned or frozen fruit.  4 oz (120 mL) fruit juice.  4-6 crackers.  6 chicken nuggets.  6 oz (170 g) unsweetened dry cereal.  6 oz (170 g) plain fat-free yogurt or yogurt sweetened with artificial sweeteners.  8 oz (240 mL) milk.  8 oz (170 g) fresh fruit or one small piece of fruit.  24 oz (680 g) popped popcorn. Example of carbohydrate counting Sample meal  3 oz (85 g) chicken breast.  6 oz (170 g) brown  rice.  4 oz (113 g) corn.  8 oz (240 mL) milk.  8 oz (170 g) strawberries with sugar-free whipped topping. Carbohydrate calculation 1. Identify the foods that contain carbohydrates: ? Rice. ? Corn. ? Milk. ? Strawberries. 2. Calculate how many servings you have of each food: ? 2 servings rice. ? 1 serving corn. ? 1 serving milk. ? 1 serving strawberries. 3. Multiply each number of servings by 15 g: ? 2 servings rice x 15 g = 30 g. ? 1 serving corn x 15 g = 15 g. ? 1 serving milk x 15 g = 15 g. ? 1 serving strawberries x 15 g = 15 g. 4. Add together all of the amounts to find the total grams of carbohydrates eaten: ? 30 g + 15 g + 15 g + 15 g = 75 g of carbohydrates total. Summary  Carbohydrate counting is a method of keeping track of how many carbohydrates you eat.  Eating carbohydrates naturally increases the amount of sugar (glucose) in the blood.  Counting how many carbohydrates you eat helps keep your blood glucose within normal limits, which helps you manage your diabetes.  A diet and nutrition specialist (registered dietitian) can help you make a meal plan and calculate how many carbohydrates you should have at each meal and snack. This information is not intended to replace advice given to you by your health care provider. Make sure you discuss any questions you have with your health care provider. Document Released: 04/18/2005 Document Revised: 10/26/2016   Document Reviewed: 09/30/2015 Elsevier Interactive Patient Education  [employer].

## 2018-07-19 LAB — URINALYSIS, COMPLETE W/RFL CULTURE
BILIRUBIN URINE: NEGATIVE
Bacteria, UA: NONE SEEN /HPF
GLUCOSE, UA: NEGATIVE
HGB URINE DIPSTICK: NEGATIVE
Hyaline Cast: NONE SEEN /LPF
Ketones, ur: NEGATIVE
LEUKOCYTE ESTERASE: NEGATIVE
NITRITES URINE, INITIAL: NEGATIVE
PH: 6.5 (ref 5.0–8.0)
PROTEIN: NEGATIVE
RBC / HPF: NONE SEEN /HPF (ref 0–2)
Specific Gravity, Urine: 1.013 (ref 1.001–1.03)
Squamous Epithelial / LPF: NONE SEEN /HPF (ref <=5)
WBC UA: NONE SEEN /HPF (ref 0–5)

## 2018-07-19 LAB — NO CULTURE INDICATED

## 2018-07-24 ENCOUNTER — Other Ambulatory Visit (HOSPITAL_COMMUNITY): Payer: Self-pay | Admitting: Family Medicine

## 2018-07-24 ENCOUNTER — Other Ambulatory Visit: Payer: Self-pay | Admitting: Family Medicine

## 2018-07-24 ENCOUNTER — Other Ambulatory Visit: Payer: Self-pay

## 2018-07-24 ENCOUNTER — Ambulatory Visit (HOSPITAL_COMMUNITY)
Admission: RE | Admit: 2018-07-24 | Discharge: 2018-07-24 | Disposition: A | Discharge status: Home / Self Care | Payer: 59 | Source: Ambulatory Visit | Attending: Family Medicine | Admitting: Family Medicine

## 2018-07-24 DIAGNOSIS — R51 Headache: Secondary | ICD-10-CM | POA: Insufficient documentation

## 2018-07-24 DIAGNOSIS — R519 Headache, unspecified: Secondary | ICD-10-CM

## 2018-07-24 DIAGNOSIS — Z1389 Encounter for screening for other disorder: Secondary | ICD-10-CM | POA: Diagnosis not present

## 2018-07-24 DIAGNOSIS — G4700 Insomnia, unspecified: Secondary | ICD-10-CM | POA: Diagnosis not present

## 2018-07-24 DIAGNOSIS — G8929 Other chronic pain: Secondary | ICD-10-CM

## 2018-07-24 DIAGNOSIS — R5383 Other fatigue: Secondary | ICD-10-CM | POA: Diagnosis not present

## 2018-07-25 ENCOUNTER — Other Ambulatory Visit (HOSPITAL_COMMUNITY): Payer: Self-pay | Admitting: Family Medicine

## 2018-07-25 ENCOUNTER — Other Ambulatory Visit: Payer: Self-pay | Admitting: Family Medicine

## 2018-07-25 DIAGNOSIS — E049 Nontoxic goiter, unspecified: Secondary | ICD-10-CM

## 2018-08-03 DIAGNOSIS — E538 Deficiency of other specified B group vitamins: Secondary | ICD-10-CM | POA: Diagnosis not present

## 2018-08-31 ENCOUNTER — Other Ambulatory Visit: Payer: Self-pay

## 2018-08-31 ENCOUNTER — Ambulatory Visit (HOSPITAL_COMMUNITY)
Admission: RE | Admit: 2018-08-31 | Discharge: 2018-08-31 | Disposition: A | Discharge status: Home / Self Care | Payer: 59 | Source: Ambulatory Visit | Attending: Family Medicine | Admitting: Family Medicine

## 2018-08-31 DIAGNOSIS — R946 Abnormal results of thyroid function studies: Secondary | ICD-10-CM | POA: Diagnosis not present

## 2018-08-31 DIAGNOSIS — E049 Nontoxic goiter, unspecified: Secondary | ICD-10-CM

## 2018-09-03 DIAGNOSIS — Z6835 Body mass index (BMI) 35.0-35.9, adult: Secondary | ICD-10-CM | POA: Diagnosis not present

## 2018-09-03 DIAGNOSIS — E6609 Other obesity due to excess calories: Secondary | ICD-10-CM | POA: Diagnosis not present

## 2018-09-03 DIAGNOSIS — E538 Deficiency of other specified B group vitamins: Secondary | ICD-10-CM | POA: Diagnosis not present

## 2018-09-03 DIAGNOSIS — E039 Hypothyroidism, unspecified: Secondary | ICD-10-CM | POA: Diagnosis not present

## 2018-09-18 DIAGNOSIS — G43109 Migraine with aura, not intractable, without status migrainosus: Secondary | ICD-10-CM | POA: Diagnosis not present

## 2019-05-09 ENCOUNTER — Other Ambulatory Visit: Payer: Self-pay

## 2019-05-09 ENCOUNTER — Ambulatory Visit: Payer: 59 | Attending: Internal Medicine

## 2019-05-09 DIAGNOSIS — Z20822 Contact with and (suspected) exposure to covid-19: Secondary | ICD-10-CM

## 2019-05-11 LAB — NOVEL CORONAVIRUS, NAA: SARS-CoV-2, NAA: DETECTED — AB

## 2019-06-06 ENCOUNTER — Telehealth: Payer: Self-pay

## 2019-06-06 NOTE — Telephone Encounter (Signed)
Okay for [employer] tablet 28 same as Sprintec.

## 2019-06-06 NOTE — Telephone Encounter (Signed)
Okay for Ortho-Cyclen 1 p.o. daily dispense 3 packs no refills.(Ortho-Cyclen's generic is Sprintec)

## 2019-06-06 NOTE — Telephone Encounter (Signed)
Yesenia Scott, pharmacy actually referred to current Rx as "[employer]ili Tablets 28".  Not sure if that is actually generic Sprintec (I should not assume).

## 2019-06-06 NOTE — Telephone Encounter (Signed)
Note from pharmacy regarding generic Sprintec she is on.  "Drug not covered by patient's plan. The preferred alternative is OrthoCyclen Tab. Please send the pharmacy a change in  Medication with directions and quantity/refills."  Annual exam is due in March 2021.

## 2019-06-07 MED ORDER — NORGESTIMATE-ETH ESTRADIOL 0.25-35 MG-MCG PO TABS: 1.0000 | ORAL_TABLET | Freq: Every day | ORAL | 0 refills | Status: DC

## 2019-06-07 NOTE — Telephone Encounter (Signed)
Annual exam scheduled on 07/24/19, Rx sent.

## 2019-07-23 ENCOUNTER — Other Ambulatory Visit: Payer: Self-pay

## 2019-07-24 ENCOUNTER — Ambulatory Visit (INDEPENDENT_AMBULATORY_CARE_PROVIDER_SITE_OTHER): Payer: 59 | Admitting: Women's Health

## 2019-07-24 ENCOUNTER — Encounter: Payer: Self-pay | Admitting: Women's Health

## 2019-07-24 VITALS — BP 128/80 | Ht 65.0 in | Wt 207.0 lb

## 2019-07-24 DIAGNOSIS — Z01419 Encounter for gynecological examination (general) (routine) without abnormal findings: Secondary | ICD-10-CM

## 2019-07-24 MED ORDER — NORETHIN ACE-ETH ESTRAD-FE 1-20 MG-MCG PO TABS: ORAL_TABLET | ORAL | 4 refills | Status: DC

## 2019-07-24 NOTE — Patient Instructions (Signed)
MVI daily It has been a pleasure knowing caring for you Health Maintenance, Female Adopting a healthy lifestyle and getting preventive care are important in promoting health and wellness. Ask your health care provider about:  The right schedule for you to have regular tests and exams.  Things you can do on your own to prevent diseases and keep yourself healthy. What should I know about diet, weight, and exercise? Eat a healthy diet   Eat a diet that includes plenty of vegetables, fruits, low-fat dairy products, and lean protein.  Do not eat a lot of foods that are high in solid fats, added sugars, or sodium. Maintain a healthy weight Body mass index (BMI) is used to identify weight problems. It estimates body fat based on height and weight. Your health care provider can help determine your BMI and help you achieve or maintain a healthy weight. Get regular exercise Get regular exercise. This is one of the most important things you can do for your health. Most adults should:  Exercise for at least 150 minutes each week. The exercise should increase your heart rate and make you sweat (moderate-intensity exercise).  Do strengthening exercises at least twice a week. This is in addition to the moderate-intensity exercise.  Spend less time sitting. Even light physical activity can be beneficial. Watch cholesterol and blood lipids Have your blood tested for lipids and cholesterol at 28 years of age, then have this test every 5 years. Have your cholesterol levels checked more often if:  Your lipid or cholesterol levels are high.  You are older than 28 years of age.  You are at high risk for heart disease. What should I know about cancer screening? Depending on your health history and family history, you may need to have cancer screening at various ages. This may include screening for:  Breast cancer.  Cervical cancer.  Colorectal cancer.  Skin cancer.  Lung cancer. What should I  know about heart disease, diabetes, and high blood pressure? Blood pressure and heart disease  High blood pressure causes heart disease and increases the risk of stroke. This is more likely to develop in people who have high blood pressure readings, are of African descent, or are overweight.  Have your blood pressure checked: ? Every 3-5 years if you are 58-84 years of age. ? Every year if you are 28 years old or older. Diabetes Have regular diabetes screenings. This checks your fasting blood sugar level. Have the screening done:  Once every three years after age 90 if you are at a normal weight and have a low risk for diabetes.  More often and at a younger age if you are overweight or have a high risk for diabetes. What should I know about preventing infection? Hepatitis B If you have a higher risk for hepatitis B, you should be screened for this virus. Talk with your health care provider to find out if you are at risk for hepatitis B infection. Hepatitis C Testing is recommended for:  Everyone born from [birth year] through 1964.  Anyone with known risk factors for hepatitis C. Sexually transmitted infections (STIs)  Get screened for STIs, including gonorrhea and chlamydia, if: ? You are sexually active and are younger than 28 years of age. ? You are older than 28 years of age and your health care provider tells you that you are at risk for this type of infection. ? Your sexual activity has changed since you were last screened, and you are at increased  risk for chlamydia or gonorrhea. Ask your health care provider if you are at risk.  Ask your health care provider about whether you are at high risk for HIV. Your health care provider may recommend a prescription medicine to help prevent HIV infection. If you choose to take medicine to prevent HIV, you should first get tested for HIV. You should then be tested every 3 months for as long as you are taking the medicine. Pregnancy  If you are  about to stop having your period (premenopausal) and you may become pregnant, seek counseling before you get pregnant.  Take 400 to 800 micrograms (mcg) of folic acid every day if you become pregnant.  Ask for birth control (contraception) if you want to prevent pregnancy. Osteoporosis and menopause Osteoporosis is a disease in which the bones lose minerals and strength with aging. This can result in bone fractures. If you are 9 years old or older, or if you are at risk for osteoporosis and fractures, ask your health care provider if you should:  Be screened for bone loss.  Take a calcium or vitamin D supplement to lower your risk of fractures.  Be given hormone replacement therapy (HRT) to treat symptoms of menopause. Follow these instructions at home: Lifestyle  Do not use any products that contain nicotine or tobacco, such as cigarettes, e-cigarettes, and chewing tobacco. If you need help quitting, ask your health care provider.  Do not use street drugs.  Do not share needles.  Ask your health care provider for help if you need support or information about quitting drugs. Alcohol use  Do not drink alcohol if: ? Your health care provider tells you not to drink. ? You are pregnant, may be pregnant, or are planning to become pregnant.  If you drink alcohol: ? Limit how much you use to 0-1 drink a day. ? Limit intake if you are breastfeeding.  Be aware of how much alcohol is in your drink. In the [place] one drink equals one 12 oz bottle of beer (355 mL), one 5 oz glass of wine (148 mL), or one 1 oz glass of hard liquor (44 mL). General instructions  Schedule regular health, dental, and eye exams.  Stay current with your vaccines.  Tell your health care provider if: ? You often feel depressed. ? You have ever been abused or do not feel safe at home. Summary  Adopting a healthy lifestyle and getting preventive care are important in promoting health and wellness.  Follow  your health care provider's instructions about healthy diet, exercising, and getting tested or screened for diseases.  Follow your health care provider's instructions on monitoring your cholesterol and blood pressure. This information is not intended to replace advice given to you by your health care provider. Make sure you discuss any questions you have with your health care provider. Document Revised: 04/11/2018 Document Reviewed: 04/11/2018 Elsevier Patient Education  2020 [employer].

## 2019-07-24 NOTE — Progress Notes (Signed)
Yesenia Scott Sep 17, 1990 [id number]    History:    Presents for annual exam.  Amenorrheic on Sprintec continuously.  Same-sex partner greater than 5 years recently married, partner pursuing IUI's with donor insemination.  Normal Pap history.  Gardasil series completed.  05/2019 had Covid, doing much better.   History of an L5 fracture from sports.  Past medical history, past surgical history, family history and social history were all reviewed and documented in the EPIC chart.  Works at [employer] is at [employer] for business.  Mother diabetes and hypertension.  ROS:  A ROS was performed and pertinent positives and negatives are included.  Exam:  Vitals:   07/24/19 0816  BP: 128/80  Weight: 207 lb (93.9 kg)  Height: 5\' 5"  (1.651 m)   Body mass index is 34.45 kg/m.   General appearance:  Normal Thyroid:  Symmetrical, normal in size, without palpable masses or nodularity. Respiratory  Auscultation:  Clear without wheezing or rhonchi Cardiovascular  Auscultation:  Regular rate, without rubs, murmurs or gallops  Edema/varicosities:  Not grossly evident Abdominal  Soft,nontender, without masses, guarding or rebound.  Liver/spleen:  No organomegaly noted  Hernia:  None appreciated  Skin  Inspection:  Grossly normal   Breasts: Examined lying and sitting.     Right: Without masses, retractions, discharge or axillary adenopathy.     Left: Without masses, retractions, discharge or axillary adenopathy. Gentitourinary   Inguinal/mons:  Normal without inguinal adenopathy  External genitalia:  Normal  BUS/Urethra/Skene's glands:  Normal  Vagina:  Normal  Cervix:  Normal  Uterus:  normal in size, shape and contour.  Midline and mobile  Adnexa/parametria:     Rt: Without masses or tenderness.   Lt: Without masses or tenderness.  Anus and perineum: Normal    Assessment/Plan:  29 y.o. MWF (same sex partner) G0 for annual exam with no complaints.  Cycles every 2 to 3  months on Loestrin continuously. Hypothyroid-primary care manages labs and meds  Plan: Loestrin 1/20 prescription, proper use, slight risk for blood clots and strokes reviewed.  Has been on Ortho-Cyclen was having increased headaches we will see if a lower estrogen pill helps with decreasing frequency of headaches.  Instructed to call if continued problems or problem with change.  SBEs, exercise, calcium rich foods, MVI daily encouraged.  Encouraged to decrease calories/carbs.  Pap normal 2018, new screening guidelines reviewed.  Pap pending    Huel Cote [facility], 8:24 AM 07/24/2019

## 2019-07-25 LAB — URINALYSIS, COMPLETE W/RFL CULTURE
Bacteria, UA: NONE SEEN /HPF
Bilirubin Urine: NEGATIVE
Glucose, UA: NEGATIVE
Hgb urine dipstick: NEGATIVE
Hyaline Cast: NONE SEEN /LPF
Ketones, ur: NEGATIVE
Leukocyte Esterase: NEGATIVE
Nitrites, Initial: NEGATIVE
Protein, ur: NEGATIVE
RBC / HPF: NONE SEEN /HPF (ref 0–2)
Specific Gravity, Urine: 1.015 (ref 1.001–1.03)
pH: 7 (ref 5.0–8.0)

## 2019-07-26 LAB — PAP IG W/ RFLX HPV ASCU

## 2020-07-03 ENCOUNTER — Telehealth: Payer: 59 | Admitting: Nurse Practitioner

## 2020-07-03 DIAGNOSIS — B9789 Other viral agents as the cause of diseases classified elsewhere: Secondary | ICD-10-CM

## 2020-07-03 DIAGNOSIS — J019 Acute sinusitis, unspecified: Secondary | ICD-10-CM

## 2020-07-03 MED ORDER — FLUTICASONE PROPIONATE 50 MCG/ACT NA SUSP: 2.0000 | Freq: Every day | NASAL | 6 refills | Status: DC

## 2020-07-03 NOTE — Progress Notes (Signed)
We are sorry that you are not feeling well.  Here is how we plan to help!  Based on what you have shared with me it looks like you have sinusitis.  Sinusitis is inflammation and infection in the sinus cavities of the head.  Based on your presentation I believe you most likely have Acute Viral Sinusitis.This is an infection most likely caused by a virus. There is not specific treatment for viral sinusitis other than to help you with the symptoms until the infection runs its course.  You may use an oral decongestant such as Mucinex D or if you have glaucoma or high blood pressure use plain Mucinex. Saline nasal spray help and can safely be used as often as needed for congestion, I have prescribed: Fluticasone nasal spray two sprays in each nostril once a day   Providers prescribe antibiotics to treat infections caused by bacteria. Antibiotics are very powerful in treating bacterial infections when they are used properly. To maintain their effectiveness, they should be used only when necessary. Overuse of antibiotics has resulted in the development of superbugs that are resistant to treatment!    After careful review of your answers, I would not recommend an antibiotic for your condition.  Antibiotics are not effective against viruses and therefore should not be used to treat them. Common examples of infections caused by viruses include colds and flu    Some authorities believe that zinc sprays or the use of Echinacea may shorten the course of your symptoms.  Sinus infections are not as easily transmitted as other respiratory infection, however we still recommend that you avoid close contact with loved ones, especially the very young and elderly.  Remember to wash your hands thoroughly throughout the day as this is the number one way to prevent the spread of infection!  Home Care:  Only take medications as instructed by your medical team.  Do not take these medications with alcohol.  A steam or  ultrasonic humidifier can help congestion.  You can place a towel over your head and breathe in the steam from hot water coming from a faucet.  Avoid close contacts especially the very young and the elderly.  Cover your mouth when you cough or sneeze.  Always remember to wash your hands.  Get Help Right Away If:  You develop worsening fever or sinus pain.  You develop a severe head ache or visual changes.  Your symptoms persist after you have completed your treatment plan.  Make sure you  Understand these instructions.  Will watch your condition.  Will get help right away if you are not doing well or get worse.  Your e-visit answers were reviewed by a board certified advanced clinical practitioner to complete your personal care plan.  Depending on the condition, your plan could have included both over the counter or prescription medications.  If there is a problem please reply  once you have received a response from your provider.  Your safety is important to us.  If you have drug allergies check your prescription carefully.    You can use MyChart to ask questions about today's visit, request a non-urgent call back, or ask for a work or school excuse for 24 hours related to this e-Visit. If it has been greater than 24 hours you will need to follow up with your provider, or enter a new e-Visit to address those concerns.  You will get an e-mail in the next two days asking about your experience.  I   hope that your e-visit has been valuable and will speed your recovery. Thank you for using e-visits.   5-10 minutes spent reviewing and documenting in chart.  

## 2020-07-04 ENCOUNTER — Encounter: Payer: Self-pay | Admitting: Nurse Practitioner

## 2020-07-27 ENCOUNTER — Other Ambulatory Visit: Payer: Self-pay

## 2020-07-27 ENCOUNTER — Ambulatory Visit (INDEPENDENT_AMBULATORY_CARE_PROVIDER_SITE_OTHER): Payer: 59 | Admitting: Nurse Practitioner

## 2020-07-27 ENCOUNTER — Encounter: Payer: Self-pay | Admitting: Nurse Practitioner

## 2020-07-27 VITALS — BP 124/80 | Ht 66.0 in | Wt 243.0 lb

## 2020-07-27 DIAGNOSIS — Z01419 Encounter for gynecological examination (general) (routine) without abnormal findings: Secondary | ICD-10-CM | POA: Diagnosis not present

## 2020-07-27 DIAGNOSIS — R6882 Decreased libido: Secondary | ICD-10-CM | POA: Diagnosis not present

## 2020-07-27 DIAGNOSIS — Z3041 Encounter for surveillance of contraceptive pills: Secondary | ICD-10-CM

## 2020-07-27 MED ORDER — NORETHIN ACE-ETH ESTRAD-FE 1-20 MG-MCG PO TABS: ORAL_TABLET | ORAL | 4 refills | Status: DC

## 2020-07-27 NOTE — Patient Instructions (Signed)
Health Maintenance, Female Adopting a healthy lifestyle and getting preventive care are important in promoting health and wellness. Ask your health care provider about:  The right schedule for you to have regular tests and exams.  Things you can do on your own to prevent diseases and keep yourself healthy. What should I know about diet, weight, and exercise? Eat a healthy diet  Eat a diet that includes plenty of vegetables, fruits, low-fat dairy products, and lean protein.  Do not eat a lot of foods that are high in solid fats, added sugars, or sodium.   Maintain a healthy weight Body mass index (BMI) is used to identify weight problems. It estimates body fat based on height and weight. Your health care provider can help determine your BMI and help you achieve or maintain a healthy weight. Get regular exercise Get regular exercise. This is one of the most important things you can do for your health. Most adults should:  Exercise for at least 150 minutes each week. The exercise should increase your heart rate and make you sweat (moderate-intensity exercise).  Do strengthening exercises at least twice a week. This is in addition to the moderate-intensity exercise.  Spend less time sitting. Even light physical activity can be beneficial. Watch cholesterol and blood lipids Have your blood tested for lipids and cholesterol at 29 years of age, then have this test every 5 years. Have your cholesterol levels checked more often if:  Your lipid or cholesterol levels are high.  You are older than 29 years of age.  You are at high risk for heart disease. What should I know about cancer screening? Depending on your health history and family history, you may need to have cancer screening at various ages. This may include screening for:  Breast cancer.  Cervical cancer.  Colorectal cancer.  Skin cancer.  Lung cancer. What should I know about heart disease, diabetes, and high blood  pressure? Blood pressure and heart disease  High blood pressure causes heart disease and increases the risk of stroke. This is more likely to develop in people who have high blood pressure readings, are of African descent, or are overweight.  Have your blood pressure checked: ? Every 3-5 years if you are 18-39 years of age. ? Every year if you are 40 years old or older. Diabetes Have regular diabetes screenings. This checks your fasting blood sugar level. Have the screening done:  Once every three years after age 40 if you are at a normal weight and have a low risk for diabetes.  More often and at a younger age if you are overweight or have a high risk for diabetes. What should I know about preventing infection? Hepatitis B If you have a higher risk for hepatitis B, you should be screened for this virus. Talk with your health care provider to find out if you are at risk for hepatitis B infection. Hepatitis C Testing is recommended for:  Everyone born from 1945 through 1965.  Anyone with known risk factors for hepatitis C. Sexually transmitted infections (STIs)  Get screened for STIs, including gonorrhea and chlamydia, if: ? You are sexually active and are younger than 29 years of age. ? You are older than 29 years of age and your health care provider tells you that you are at risk for this type of infection. ? Your sexual activity has changed since you were last screened, and you are at increased risk for chlamydia or gonorrhea. Ask your health care provider   if you are at risk.  Ask your health care provider about whether you are at high risk for HIV. Your health care provider may recommend a prescription medicine to help prevent HIV infection. If you choose to take medicine to prevent HIV, you should first get tested for HIV. You should then be tested every 3 months for as long as you are taking the medicine. Pregnancy  If you are about to stop having your period (premenopausal) and  you may become pregnant, seek counseling before you get pregnant.  Take 400 to 800 micrograms (mcg) of folic acid every day if you become pregnant.  Ask for birth control (contraception) if you want to prevent pregnancy. Osteoporosis and menopause Osteoporosis is a disease in which the bones lose minerals and strength with aging. This can result in bone fractures. If you are 65 years old or older, or if you are at risk for osteoporosis and fractures, ask your health care provider if you should:  Be screened for bone loss.  Take a calcium or vitamin D supplement to lower your risk of fractures.  Be given hormone replacement therapy (HRT) to treat symptoms of menopause. Follow these instructions at home: Lifestyle  Do not use any products that contain nicotine or tobacco, such as cigarettes, e-cigarettes, and chewing tobacco. If you need help quitting, ask your health care provider.  Do not use street drugs.  Do not share needles.  Ask your health care provider for help if you need support or information about quitting drugs. Alcohol use  Do not drink alcohol if: ? Your health care provider tells you not to drink. ? You are pregnant, may be pregnant, or are planning to become pregnant.  If you drink alcohol: ? Limit how much you use to 0-1 drink a day. ? Limit intake if you are breastfeeding.  Be aware of how much alcohol is in your drink. In the [place] one drink equals one 12 oz bottle of beer (355 mL), one 5 oz glass of wine (148 mL), or one 1 oz glass of hard liquor (44 mL). General instructions  Schedule regular health, dental, and eye exams.  Stay current with your vaccines.  Tell your health care provider if: ? You often feel depressed. ? You have ever been abused or do not feel safe at home. Summary  Adopting a healthy lifestyle and getting preventive care are important in promoting health and wellness.  Follow your health care provider's instructions about healthy  diet, exercising, and getting tested or screened for diseases.  Follow your health care provider's instructions on monitoring your cholesterol and blood pressure. This information is not intended to replace advice given to you by your health care provider. Make sure you discuss any questions you have with your health care provider. Document Revised: 04/11/2018 Document Reviewed: 04/11/2018 Elsevier Patient Education  2021 Elsevier Inc.  

## 2020-07-27 NOTE — Progress Notes (Signed)
   Yesenia Scott [birth year] [id number]   History:  30 y.o. G0 presents for annual exam. Amenorrheic on OCPs continuously. Normal pap history. Same sex partner - unsuccessful IUI and IVF round one for partner. Gardasil series completed. Hypothyroidism managed by PCP. She complains of low libido that is not new for her but her partner is concerned.   Gynecologic History Patient's last menstrual period was 07/18/2020. Period Cycle (Days): 90 Period Duration (Days): 7 Period Pattern: Regular Menstrual Flow: Moderate,Heavy Dysmenorrhea: (!) Mild Dysmenorrhea Symptoms: Cramping Contraception/Family planning: OCP (estrogen/progesterone), same sex partner  Health Maintenance Last Pap: 07/24/2019. Results were: normal Last mammogram: N/A  Last colonoscopy: N/A Last Dexa: N/A   Past medical history, past surgical history, family history and social history were all reviewed and documented in the EPIC chart.  ROS:  A ROS was performed and pertinent positives and negatives are included.  Exam:  Vitals:   07/27/20 0846  BP: 124/80  Weight: 243 lb (110.2 kg)  Height: 5\' 6"  (1.676 m)   Body mass index is 39.22 kg/m.  General appearance:  Normal Thyroid:  Symmetrical, normal in size, without palpable masses or nodularity. Respiratory  Auscultation:  Clear without wheezing or rhonchi Cardiovascular  Auscultation:  Regular rate, without rubs, murmurs or gallops  Edema/varicosities:  Not grossly evident Abdominal  Soft,nontender, without masses, guarding or rebound.  Liver/spleen:  No organomegaly noted  Hernia:  None appreciated  Skin  Inspection:  Grossly normal   Breasts: Examined lying and sitting.   Right: Without masses, retractions, discharge or axillary adenopathy.   Left: Without masses, retractions, discharge or axillary adenopathy. Gentitourinary   Inguinal/mons:  Normal without inguinal adenopathy  External genitalia:  Normal  BUS/Urethra/Skene's glands:   Normal  Vagina:  Normal  Cervix:  Normal  Uterus:  Normal in size, shape and contour.  Midline and mobile  Adnexa/parametria:     Rt: Without masses or tenderness.   Lt: Without masses or tenderness.  Anus and perineum: Normal  Assessment/Plan:  30 y.o. G0 for annual exam.   Well female exam with routine gynecological exam - Education provided on SBEs, importance of preventative screenings, current guidelines, high calcium diet, regular exercise, and multivitamin daily. Labs with PCP.   Encounter for surveillance of contraceptive pills - Plan: norethindrone-ethinyl estradiol (LOESTRIN FE) 1-20 MG-MCG tablet daily. Takes continuously, amenorrheic. Taking as prescribed. Refill x 1 year provided.   Low libido - We discussed that this can be multifaceted. Hypothyroidism, SSRI, weight gain, and fatigue are some factors that could be affecting her. We discussed management and ways to try to improve this. All questions answered.   Screening of cervical cancer - normal pap history. Will repeat at 3-year interval per guidelines.  Return in 1 year for annual.    Tamela Gammon DNP, 9:00 AM 07/27/2020

## 2021-01-11 IMAGING — CT CT HEAD WITHOUT CONTRAST
3 series · 16 of 47 positions shown, 19 images · non-contrast
Comparison: None.

CLINICAL DATA: Right sided headaches for 3 weeks.

EXAM:
CT HEAD WITHOUT CONTRAST
TECHNIQUE: Contiguous axial images were obtained from the base of the skull
through the vertex without intravenous contrast.

[Series 2: head wo · axial · 0.42mm/px · z∈[+20,+150]mm · 10 of 32 slices shown, 13 images]
[im 3/32  brain]
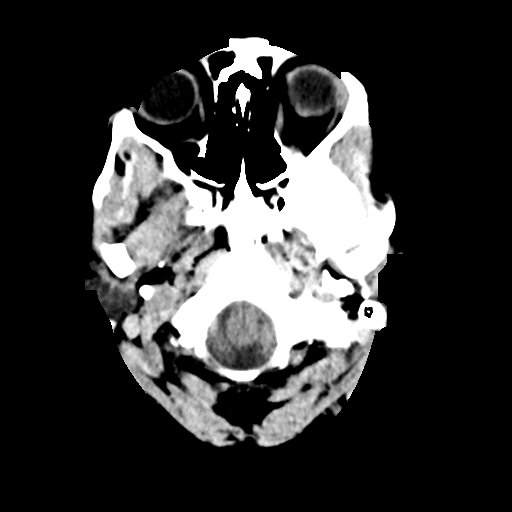
[im 3/32  bone]
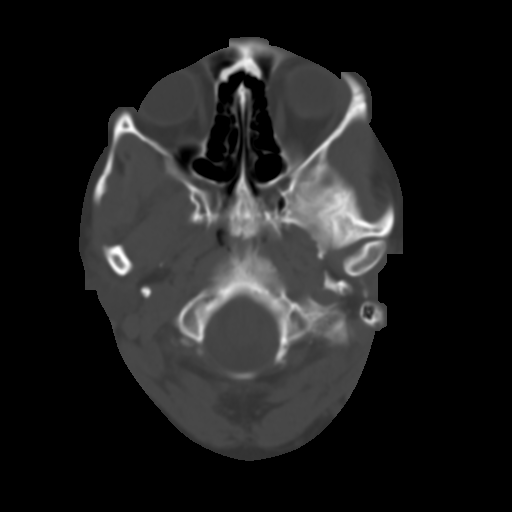
[im 6/32  brain]
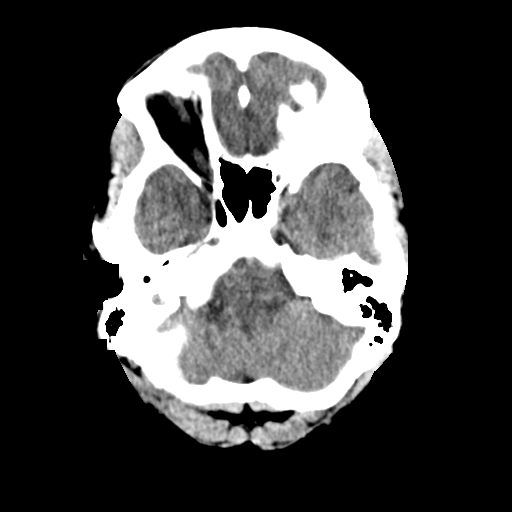
[im 9/32  brain]
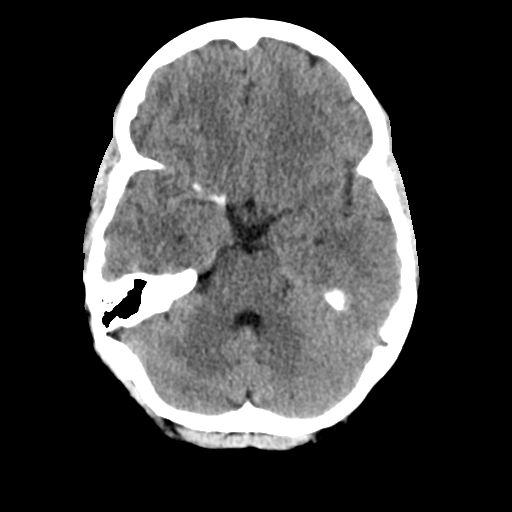
[im 11/32  brain]
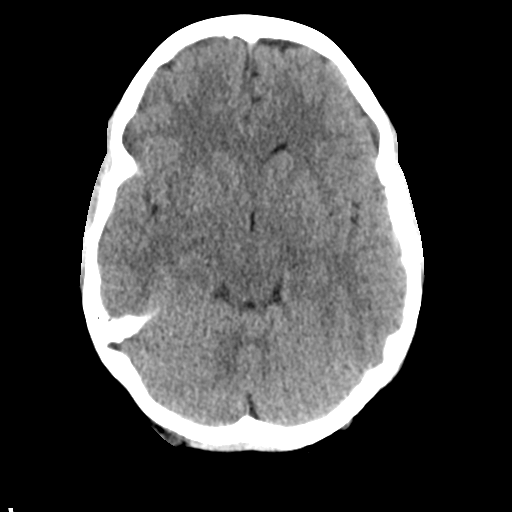
[im 14/32  brain]
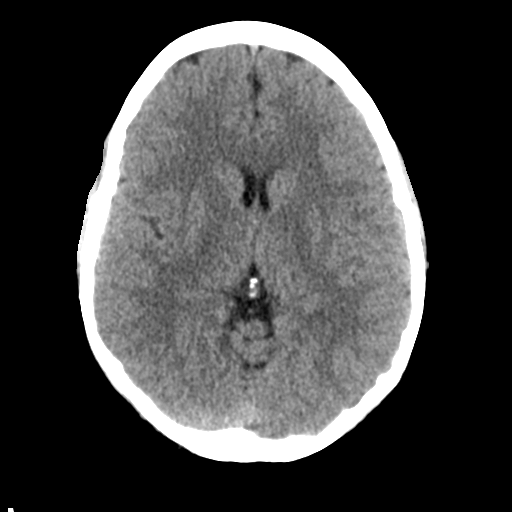
[im 14/32  bone]
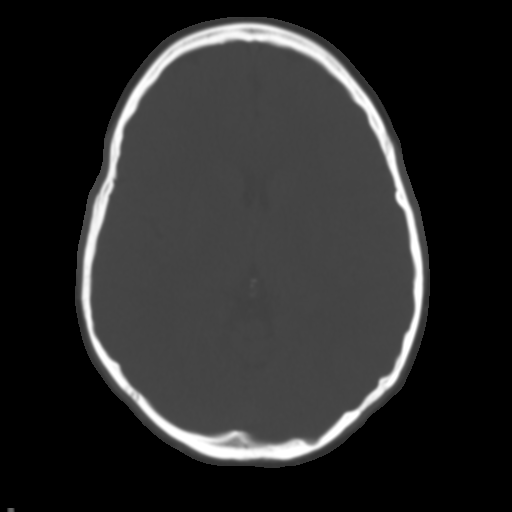
[im 18/32  brain]
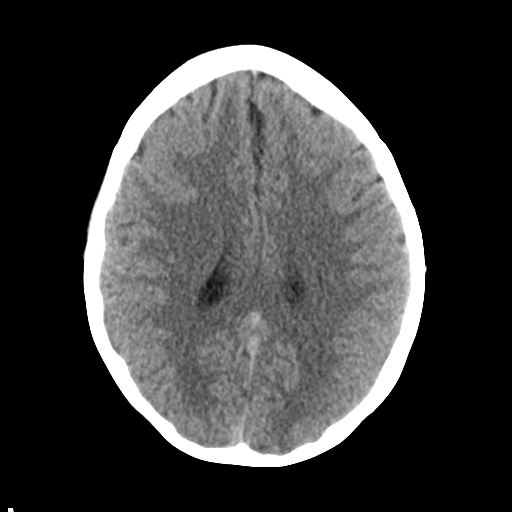
[im 21/32  brain]
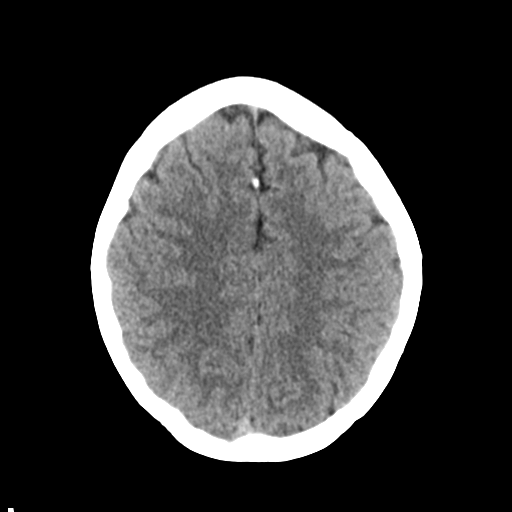
[im 24/32  brain]
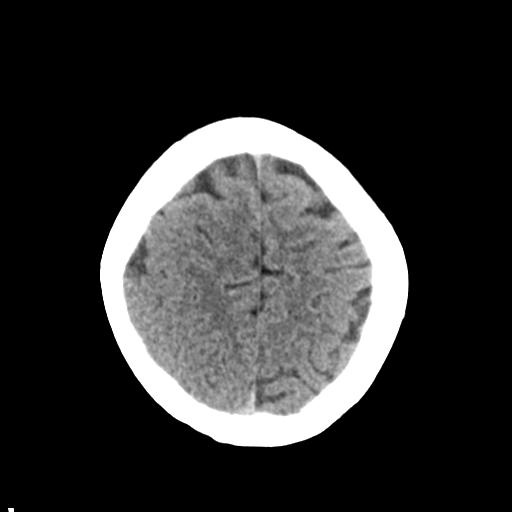
[im 26/32  brain]
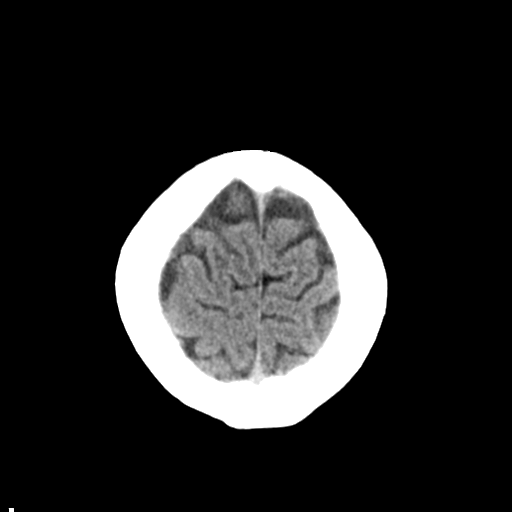
[im 26/32  bone]
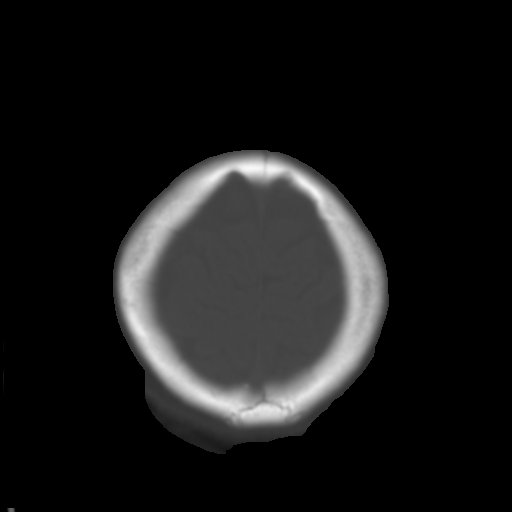
[im 29/32  brain]
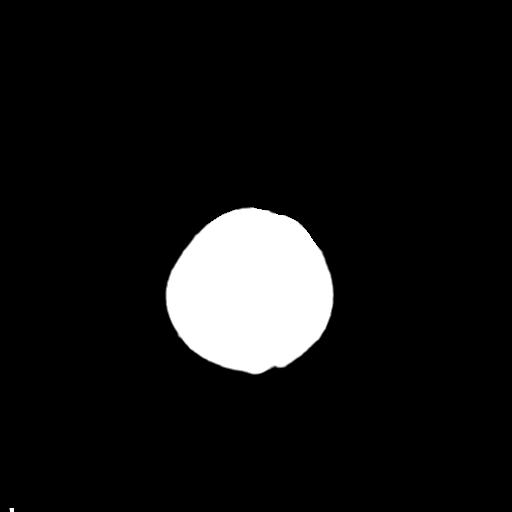

[Series 4: coronal soft tissue · coronal · 0.31mm/px · 3 of 66 slices shown]
[im 22/66  brain]
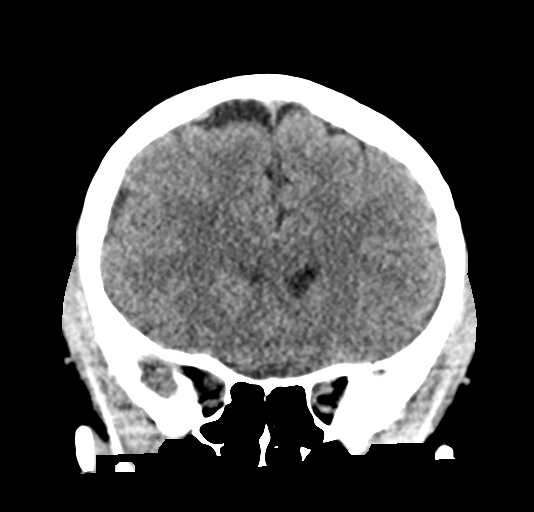
[im 29/66  brain]
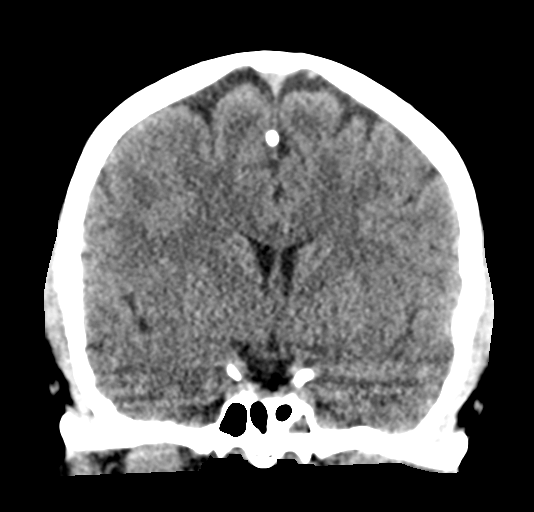
[im 37/66  brain]
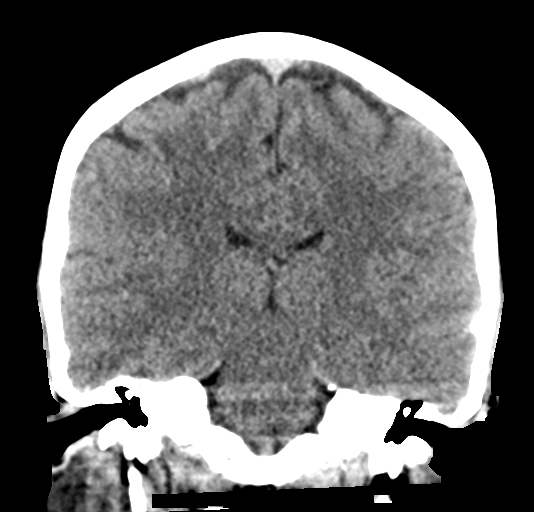

[Series 5: sagittal soft tissue · sagittal · 0.31mm/px · 3 of 56 slices shown]
[im 19/56  brain]
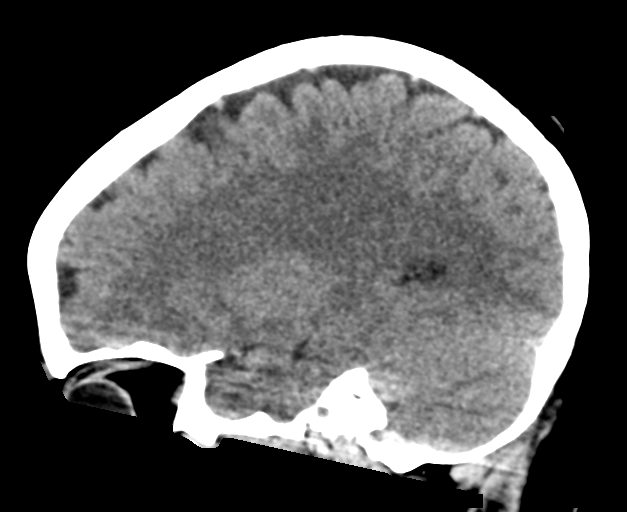
[im 28/56  brain]
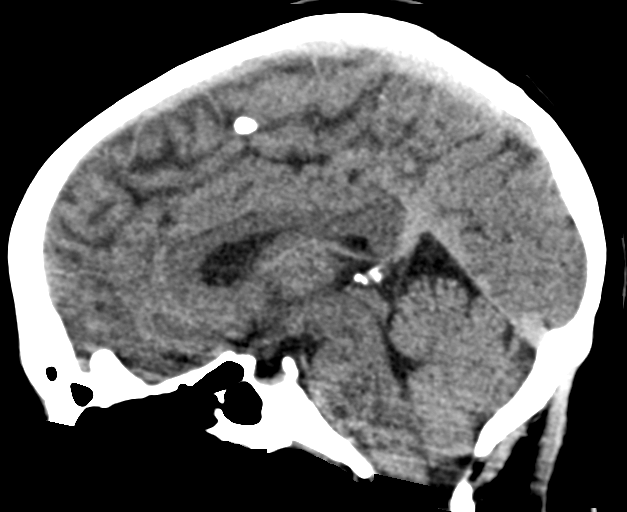
[im 37/56  brain]
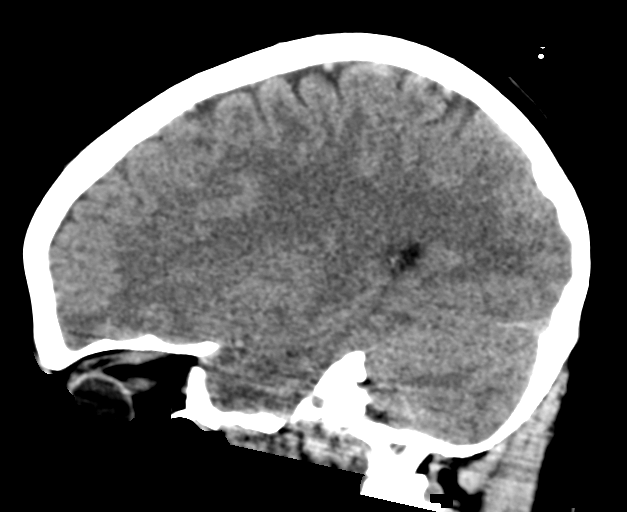

[16 of 47 positions shown; findings below may reference images not displayed]

FINDINGS: Brain: No evidence of acute infarction, hemorrhage, hydrocephalus,
extra-axial collection or mass lesion/mass effect.

Vascular: No hyperdense vessel or unexpected calcification.

Skull: Normal. Negative for fracture or focal lesion.

Sinuses/Orbits: No acute finding.

Other: None.
IMPRESSION: Negative head CT.

These results will be called to the ordering clinician or
representative by the [HOSPITAL] at the imaging location.

## 2021-07-28 ENCOUNTER — Encounter: Payer: Self-pay | Admitting: Nurse Practitioner

## 2021-07-28 NOTE — Progress Notes (Signed)
? ?  Yesenia Scott 09-Feb-1991 [id number] ? ? ?History:  31 y.o. G0 presents for annual exam. OCPs continuously with withdrawal bleed every 3 months.. Normal pap history. Same sex partner - wife had successful second round of IVF. 4 mo son. Gardasil series completed. Hypothyroidism managed by PCP.  ? ?Gynecologic History ?Patient's last menstrual period was 07/01/2021. ?Period Cycle (Days): 90 ?Period Duration (Days): 5 ?Period Pattern: Regular ?Menstrual Flow: Moderate ?Dysmenorrhea: (!) Mild ?Dysmenorrhea Symptoms: Cramping ?Contraception/Family planning: OCP (estrogen/progesterone), same sex partner ?Sexually active: Yes ? ?Health Maintenance ?Last Pap: 07/24/2019. Results were: Normal ?Last mammogram: Not indicated ?Last colonoscopy: Not indicated ?Last Dexa: Not indicated  ? ?Past medical history, past surgical history, family history and social history were all reviewed and documented in the EPIC chart. Married. Works in Engineer, production. 4 mo son, Yesenia Scott.  ? ?ROS:  A ROS was performed and pertinent positives and negatives are included. ? ?Exam: ? ?Vitals:  ? 07/29/21 0806  ?BP: 126/84  ?Weight: 249 lb (112.9 kg)  ?Height: '5\' 4"'$  (1.626 m)  ? ? ?Body mass index is 42.74 kg/m?. ? ?General appearance:  Normal ?Thyroid:  Symmetrical, normal in size, without palpable masses or nodularity. ?Respiratory ? Auscultation:  Clear without wheezing or rhonchi ?Cardiovascular ? Auscultation:  Regular rate, without rubs, murmurs or gallops ? Edema/varicosities:  Not grossly evident ?Abdominal ? Soft,nontender, without masses, guarding or rebound. ? Liver/spleen:  No organomegaly noted ? Hernia:  None appreciated ? Skin ? Inspection:  Grossly normal ?  ?Breasts: Examined lying and sitting.  ? Right: Without masses, retractions, discharge or axillary adenopathy. ? ? Left: Without masses, retractions, discharge or axillary adenopathy. ?Genitourinary Not indicated ? ?Patient informed chaperone available to be present for breast  and pelvic exam. Patient has requested no chaperone to be present. Patient has been advised what will be completed during breast and pelvic exam.  ? ?Assessment/Plan:  31 y.o. G0 for annual exam.  ? ?Well female exam with routine gynecological exam - Education provided on SBEs, importance of preventative screenings, current guidelines, high calcium diet, regular exercise, and multivitamin daily. Labs with PCP.  ? ?Encounter for surveillance of contraceptive pills - Plan: norethindrone-ethinyl estradiol (LOESTRIN FE) 1-20 MG-MCG tablet daily. Takes continuously with withdrawal bleed every 3 months. Taking as prescribed. Refill x 1 year provided.  ? ?Screening of cervical cancer - Normal pap history. Will repeat at 3-year interval per guidelines. ? ?Return in 1 year for annual.  ? ? ?Tamela Gammon DNP, 8:31 AM 07/29/2021 ? ?

## 2021-07-29 ENCOUNTER — Ambulatory Visit (INDEPENDENT_AMBULATORY_CARE_PROVIDER_SITE_OTHER): Payer: 59 | Admitting: Nurse Practitioner

## 2021-07-29 ENCOUNTER — Encounter: Payer: Self-pay | Admitting: Nurse Practitioner

## 2021-07-29 VITALS — BP 126/84 | Ht 64.0 in | Wt 249.0 lb

## 2021-07-29 DIAGNOSIS — Z01419 Encounter for gynecological examination (general) (routine) without abnormal findings: Secondary | ICD-10-CM

## 2021-07-29 DIAGNOSIS — Z3041 Encounter for surveillance of contraceptive pills: Secondary | ICD-10-CM | POA: Diagnosis not present

## 2021-07-29 MED ORDER — NORETHIN ACE-ETH ESTRAD-FE 1-20 MG-MCG PO TABS: ORAL_TABLET | ORAL | 3 refills | Status: DC

## 2021-10-01 ENCOUNTER — Other Ambulatory Visit: Payer: Self-pay | Admitting: Nurse Practitioner

## 2021-10-01 DIAGNOSIS — Z3041 Encounter for surveillance of contraceptive pills: Secondary | ICD-10-CM

## 2021-10-01 NOTE — Telephone Encounter (Signed)
#  112 w/ 3RF sent on 07/29/21 to alternate pharmacy. Will refuse today.

## 2022-08-01 ENCOUNTER — Other Ambulatory Visit (HOSPITAL_COMMUNITY)
Admission: RE | Admit: 2022-08-01 | Discharge: 2022-08-01 | Disposition: A | Discharge status: Home / Self Care | Payer: 59 | Source: Ambulatory Visit | Attending: Nurse Practitioner | Admitting: Nurse Practitioner

## 2022-08-01 ENCOUNTER — Ambulatory Visit (INDEPENDENT_AMBULATORY_CARE_PROVIDER_SITE_OTHER): Payer: 59 | Admitting: Nurse Practitioner

## 2022-08-01 ENCOUNTER — Encounter: Payer: Self-pay | Admitting: Nurse Practitioner

## 2022-08-01 VITALS — BP 110/80 | HR 74 | Ht 66.0 in | Wt 237.0 lb

## 2022-08-01 DIAGNOSIS — Z124 Encounter for screening for malignant neoplasm of cervix: Secondary | ICD-10-CM | POA: Diagnosis not present

## 2022-08-01 DIAGNOSIS — N946 Dysmenorrhea, unspecified: Secondary | ICD-10-CM

## 2022-08-01 DIAGNOSIS — Z3041 Encounter for surveillance of contraceptive pills: Secondary | ICD-10-CM | POA: Diagnosis not present

## 2022-08-01 DIAGNOSIS — Z01419 Encounter for gynecological examination (general) (routine) without abnormal findings: Secondary | ICD-10-CM | POA: Diagnosis not present

## 2022-08-01 MED ORDER — NORETHIN ACE-ETH ESTRAD-FE 1-20 MG-MCG PO TABS: ORAL_TABLET | ORAL | 3 refills | Status: DC

## 2022-08-01 NOTE — Progress Notes (Signed)
   Yesenia Scott [birth year] [id number]   History:  32 y.o. G0 presents for annual exam. OCPs continuously with withdrawal bleed every 3 months.. Normal pap history. Gardasil series completed. Hypothyroidism managed by PCP.   Gynecologic History Patient's last menstrual period was 06/20/2022 (approximate).   Contraception/Family planning: OCP (estrogen/progesterone), same sex partner Sexually active: Yes  Health Maintenance Last Pap: 07/24/2019. Results were: Normal Last mammogram: Not indicated Last colonoscopy: Not indicated Last Dexa: Not indicated   Past medical history, past surgical history, family history and social history were all reviewed and documented in the EPIC chart. Married. 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   ROS:  A ROS was performed and pertinent positives and negatives are included.  Exam:  Vitals:   08/01/22 0822  BP: 110/80  Pulse: 74  Weight: 237 lb (107.5 kg)  Height: 5\' 6"  (1.676 m)     Body mass index is 38.25 kg/m.  General appearance:  Normal Thyroid:  Symmetrical, normal in size, without palpable masses or nodularity. Respiratory  Auscultation:  Clear without wheezing or rhonchi Cardiovascular  Auscultation:  Regular rate, without rubs, murmurs or gallops  Edema/varicosities:  Not grossly evident Abdominal  Soft,nontender, without masses, guarding or rebound.  Liver/spleen:  No organomegaly noted  Hernia:  None appreciated  Skin  Inspection:  Grossly normal   Breasts: Examined lying and sitting.   Right: Without masses, retractions, discharge or axillary adenopathy.   Left: Without masses, retractions, discharge or axillary adenopathy. Genitourinary   Inguinal/mons:  Normal without inguinal adenopathy  External genitalia:  Normal appearing vulva with no masses, tenderness, or lesions  BUS/Urethra/Skene's glands:  Normal  Vagina:  Normal appearing with normal color and discharge, no lesions  Cervix:  Difficult to  visualize due to discomfort of exam  Uterus:  Normal in size, shape and contour.  Midline and mobile, nontender  Adnexa/parametria:     Rt: Normal in size, without masses or tenderness.   Lt: Normal in size, without masses or tenderness.  Anus and perineum: Normal  Digital rectal exam: Not indicated  Patient informed chaperone available to be present for breast and pelvic exam. Patient has requested no chaperone to be present. Patient has been advised what will be completed during breast and pelvic exam.   Assessment/Plan:  32 y.o. G0 for annual exam.   Well female exam with routine gynecological exam - Education provided on SBEs, importance of preventative screenings, current guidelines, high calcium diet, regular exercise, and multivitamin daily. Labs with PCP.   Encounter for surveillance of contraceptive pills - Plan: norethindrone-ethinyl estradiol (LOESTRIN FE) 1-20 MG-MCG tablet daily. Takes continuously with withdrawal bleed every 3 months. Taking as prescribed. Refill x 1 year provided.   Screening for cervical cancer - Plan: Cytology - PAP( [facility]). Normal pap history.   Dysmenorrhea - Plan: norethindrone-ethinyl estradiol-FE (LOESTRIN FE) 1-20 MG-MCG tablet daily. Good management.   Return in 1 year for annual.    Tamela Gammon DNP, 8:37 AM 08/01/2022

## 2022-08-02 LAB — CYTOLOGY - PAP
Adequacy: ABSENT
Comment: NEGATIVE
Diagnosis: NEGATIVE
High risk HPV: NEGATIVE

## 2023-01-12 ENCOUNTER — Telehealth: Payer: 59 | Admitting: Physician Assistant

## 2023-01-12 DIAGNOSIS — J019 Acute sinusitis, unspecified: Secondary | ICD-10-CM

## 2023-01-12 DIAGNOSIS — B9689 Other specified bacterial agents as the cause of diseases classified elsewhere: Secondary | ICD-10-CM | POA: Diagnosis not present

## 2023-01-12 MED ORDER — AMOXICILLIN-POT CLAVULANATE 875-125 MG PO TABS: 1.0000 | ORAL_TABLET | Freq: Two times a day (BID) | ORAL | 0 refills | Status: DC

## 2023-01-12 MED ORDER — FLUTICASONE PROPIONATE 50 MCG/ACT NA SUSP
2.0000 | Freq: Every day | NASAL | 0 refills | Status: DC
Start: 2023-01-12 — End: 2023-10-09

## 2023-01-12 NOTE — Progress Notes (Signed)

## 2023-07-18 ENCOUNTER — Other Ambulatory Visit: Payer: Self-pay

## 2023-07-18 DIAGNOSIS — Z3041 Encounter for surveillance of contraceptive pills: Secondary | ICD-10-CM

## 2023-07-18 DIAGNOSIS — N946 Dysmenorrhea, unspecified: Secondary | ICD-10-CM

## 2023-07-18 MED ORDER — NORETHIN ACE-ETH ESTRAD-FE 1-20 MG-MCG PO TABS: ORAL_TABLET | ORAL | 0 refills | Status: DC

## 2023-07-18 NOTE — Telephone Encounter (Signed)
 Med refill request: [facility] Last AEX: 08/01/2022-TW Next AEX: nothing scheduled, msg sent to appt desk.  Last MMG (if hormonal med): n/a Refill authorized: rx pend.

## 2023-07-18 NOTE — Telephone Encounter (Signed)
 Jennye Moccasin, CMA Left message for patient to call and schedule appointment.

## 2023-07-18 NOTE — Telephone Encounter (Signed)
 Pt now scheduled for 10/09/2023

## 2023-10-09 ENCOUNTER — Other Ambulatory Visit: Payer: Self-pay | Admitting: Nurse Practitioner

## 2023-10-09 ENCOUNTER — Ambulatory Visit (INDEPENDENT_AMBULATORY_CARE_PROVIDER_SITE_OTHER): Admitting: Nurse Practitioner

## 2023-10-09 ENCOUNTER — Encounter: Payer: Self-pay | Admitting: Nurse Practitioner

## 2023-10-09 VITALS — BP 110/80 | HR 89 | Ht 64.25 in | Wt 257.0 lb

## 2023-10-09 DIAGNOSIS — Z3041 Encounter for surveillance of contraceptive pills: Secondary | ICD-10-CM

## 2023-10-09 DIAGNOSIS — N946 Dysmenorrhea, unspecified: Secondary | ICD-10-CM

## 2023-10-09 DIAGNOSIS — Z01419 Encounter for gynecological examination (general) (routine) without abnormal findings: Secondary | ICD-10-CM

## 2023-10-09 DIAGNOSIS — Z1331 Encounter for screening for depression: Secondary | ICD-10-CM | POA: Diagnosis not present

## 2023-10-09 NOTE — Progress Notes (Signed)
   Apryll Hinkle Jauregui 08/08/1990 [id number]   History:  33 y.o. G0 presents for annual exam. Stopped COCs. Consider egg retrieval. Normal pap history. Gardasil series completed. Hypothyroidism managed by PCP.   Gynecologic History Patient's last menstrual period was 09/15/2023 (exact date). Period Duration (Days): 5 Period Pattern: Regular Menstrual Flow: Moderate, Heavy Menstrual Control: Maxi pad, Tampon Dysmenorrhea: (!) Moderate Dysmenorrhea Symptoms: Cramping, Headache Contraception/Family planning: none, same sex partner Sexually active: Yes  Health Maintenance Last Pap: 08/01/2022. Results were: Normal neg HPV Last mammogram: Not indicated Last colonoscopy: Not indicated Last Dexa: Not indicated      10/09/2023    2:49 PM  Depression screen PHQ 2/9  Decreased Interest 0  Down, Depressed, Hopeless 0  PHQ - 2 Score 0     Past medical history, past surgical history, family history and social history were all reviewed and documented in the EPIC chart. Married. Works in Public relations account executive. 2 yo son, Jamse Mcgee.   ROS:  A ROS was performed and pertinent positives and negatives are included.  Exam:  Vitals:   10/09/23 1442  BP: 110/80  Pulse: 89  SpO2: 99%  Weight: 257 lb (116.6 kg)  Height: 5' 4.25" (1.632 m)      Body mass index is 43.77 kg/m.  General appearance:  Normal Thyroid :  Symmetrical, normal in size, without palpable masses or nodularity. Respiratory  Auscultation:  Clear without wheezing or rhonchi Cardiovascular  Auscultation:  Regular rate, without rubs, murmurs or gallops  Edema/varicosities:  Not grossly evident Abdominal  Soft,nontender, without masses, guarding or rebound.  Liver/spleen:  No organomegaly noted  Hernia:  None appreciated  Skin  Inspection:  Grossly normal   Breasts: Examined lying and sitting.   Right: Without masses, retractions, discharge or axillary adenopathy.   Left: Without masses, retractions, discharge or axillary  adenopathy. Pelvic: External genitalia:  no lesions              Urethra:  normal appearing urethra with no masses, tenderness or lesions              Bartholins and Skenes: normal                 Vagina: normal appearing vagina with normal color and discharge, no lesions              Cervix: no lesions Bimanual Exam:  Uterus:  no masses or tenderness              Adnexa: no mass, fullness, tenderness              Rectovaginal: Deferred              Anus:  normal, no lesions  Doria Garden, NP student performed exam with observation.   Assessment/Plan:  33 y.o. G0 for annual exam.   Well female exam with routine gynecological exam - Education provided on SBEs, importance of preventative screenings, current guidelines, high calcium diet, regular exercise, and multivitamin daily. Labs with PCP.   Screening for cervical cancer - Normal pap history. Will repeat at 5-year interval per guidelines.   Return in about 1 year (around 10/08/2024) for Annual.    Andee Bamberger DNP, 3:05 PM 10/09/2023

## 2023-10-10 NOTE — Telephone Encounter (Signed)
 Patient had appt for aex yesterday & stopped birth control & is considering egg retrieval. This rx denied.

## 2024-04-03 ENCOUNTER — Telehealth: Payer: Self-pay

## 2024-04-03 DIAGNOSIS — Z3041 Encounter for surveillance of contraceptive pills: Secondary | ICD-10-CM

## 2024-04-03 NOTE — Telephone Encounter (Signed)
 Med refill request: Blisovi Fe 1/20 tablets - Take 1 tablet by mouth every day. Disp: 112 Last refill:  05/24/22 Last AEX: 10/09/23 Next AEX: 10/09/24  *Received refill request from [facility], but I do not see this on her current med list, nor on her past list.    Please Advise.

## 2024-04-04 MED ORDER — NORETHIN ACE-ETH ESTRAD-FE 1-20 MG-MCG PO TABS: 1.0000 | ORAL_TABLET | Freq: Every day | ORAL | 1 refills | Status: AC

## 2024-04-04 NOTE — Telephone Encounter (Signed)
 Sent. She was on a different generic.

## 2024-10-09 ENCOUNTER — Ambulatory Visit: Admitting: Nurse Practitioner
